# Patient Record
Sex: Male | Born: 1975 | Race: Black or African American | Hispanic: No | Smoking: Current every day smoker
Health system: Southern US, Community
[De-identification: ages and names within clinical notes are randomized; demographics above are authoritative.]

## PROBLEM LIST (undated history)

## (undated) DIAGNOSIS — I10 Essential (primary) hypertension: Secondary | ICD-10-CM

---

## 2008-02-05 ENCOUNTER — Emergency Department (HOSPITAL_COMMUNITY): Admission: EM | Admit: 2008-02-05 | Discharge: 2008-02-05 | Payer: Self-pay | Admitting: Family Medicine

## 2008-06-22 ENCOUNTER — Emergency Department (HOSPITAL_COMMUNITY): Admission: EM | Admit: 2008-06-22 | Discharge: 2008-06-22 | Payer: Self-pay | Admitting: Emergency Medicine

## 2008-11-06 ENCOUNTER — Emergency Department (HOSPITAL_COMMUNITY): Admission: EM | Admit: 2008-11-06 | Discharge: 2008-11-06 | Payer: Self-pay | Admitting: Emergency Medicine

## 2009-03-20 ENCOUNTER — Emergency Department (HOSPITAL_COMMUNITY): Admission: EM | Admit: 2009-03-20 | Discharge: 2009-03-21 | Payer: Self-pay | Admitting: Emergency Medicine

## 2009-03-22 ENCOUNTER — Emergency Department (HOSPITAL_COMMUNITY): Admission: EM | Admit: 2009-03-22 | Discharge: 2009-03-22 | Payer: Self-pay | Admitting: Emergency Medicine

## 2009-04-07 ENCOUNTER — Emergency Department (HOSPITAL_COMMUNITY): Admission: EM | Admit: 2009-04-07 | Discharge: 2009-04-07 | Payer: Self-pay | Admitting: Emergency Medicine

## 2009-06-15 ENCOUNTER — Emergency Department (HOSPITAL_COMMUNITY): Admission: EM | Admit: 2009-06-15 | Discharge: 2009-06-15 | Payer: Self-pay | Admitting: Emergency Medicine

## 2009-07-10 ENCOUNTER — Emergency Department (HOSPITAL_COMMUNITY): Admission: EM | Admit: 2009-07-10 | Discharge: 2009-07-10 | Payer: Self-pay | Admitting: Emergency Medicine

## 2009-07-27 ENCOUNTER — Emergency Department (HOSPITAL_COMMUNITY): Admission: EM | Admit: 2009-07-27 | Discharge: 2009-07-28 | Payer: Self-pay | Admitting: Emergency Medicine

## 2010-03-29 ENCOUNTER — Emergency Department (HOSPITAL_COMMUNITY): Admission: EM | Admit: 2010-03-29 | Discharge: 2009-07-26 | Payer: Self-pay | Admitting: Emergency Medicine

## 2010-07-24 LAB — POCT CARDIAC MARKERS
Myoglobin, poc: 111 ng/mL (ref 12–200)
Troponin i, poc: <0.05 ng/mL (ref 0.00–0.09)

## 2010-07-24 LAB — BASIC METABOLIC PANEL
Calcium: 9.1 mg/dL (ref 8.4–10.5)
Chloride: 106 mEq/L (ref 96–112)
Creatinine, Ser: 1.16 mg/dL (ref 0.4–1.5)
GFR calc non Af Amer: >60 mL/min (ref >60)
Potassium: 3.6 mEq/L (ref 3.5–5.1)

## 2010-07-24 LAB — DIFFERENTIAL
Basophils Absolute: 0.1 10*3/uL (ref 0.0–0.1)
Basophils Relative: 1 % (ref 0–1)
Eosinophils Absolute: 0.2 10*3/uL (ref 0.0–0.7)
Eosinophils Relative: 3 % (ref 0–5)
Lymphs Abs: 2.8 10*3/uL (ref 0.7–4.0)
Monocytes Absolute: 0.7 10*3/uL (ref 0.1–1.0)
Neutrophils Relative %: 54 % (ref 43–77)

## 2010-07-24 LAB — CBC
HCT: 39.5 % (ref 39.0–52.0)
MCHC: 33.7 g/dL (ref 30.0–36.0)
RDW: 12.9 % (ref 11.5–15.5)

## 2010-08-02 LAB — COMPREHENSIVE METABOLIC PANEL
ALT: 18 U/L (ref 0–53)
Albumin: 4 g/dL (ref 3.5–5.2)
Alkaline Phosphatase: 46 U/L (ref 39–117)
BUN: 11 mg/dL (ref 6–23)
Calcium: 9.3 mg/dL (ref 8.4–10.5)
Creatinine, Ser: 1.19 mg/dL (ref 0.4–1.5)
Glucose, Bld: 108 mg/dL — ABNORMAL HIGH (ref 70–99)
Total Bilirubin: 0.6 mg/dL (ref 0.3–1.2)
Total Protein: 6.5 g/dL (ref 6.0–8.3)

## 2010-08-02 LAB — DIFFERENTIAL
Eosinophils Relative: 3 % (ref 0–5)
Monocytes Absolute: 0.4 10*3/uL (ref 0.1–1.0)
Neutrophils Relative %: 48 % (ref 43–77)

## 2010-08-02 LAB — POCT CARDIAC MARKERS
CKMB, poc: <1 ng/mL — ABNORMAL LOW (ref 1.0–8.0)
CKMB, poc: <1 ng/mL — ABNORMAL LOW (ref 1.0–8.0)
Myoglobin, poc: 64.8 ng/mL (ref 12–200)

## 2010-08-02 LAB — CBC
Hemoglobin: 14.3 g/dL (ref 13.0–17.0)
MCHC: 33.5 g/dL (ref 30.0–36.0)
RDW: 13.1 % (ref 11.5–15.5)

## 2011-01-22 LAB — POCT RAPID STREP A: Streptococcus, Group A Screen (Direct): NEGATIVE

## 2011-03-01 ENCOUNTER — Encounter: Payer: Self-pay | Admitting: *Deleted

## 2011-03-01 ENCOUNTER — Emergency Department (HOSPITAL_COMMUNITY): Payer: Self-pay

## 2011-03-01 ENCOUNTER — Emergency Department (HOSPITAL_COMMUNITY)
Admission: EM | Admit: 2011-03-01 | Discharge: 2011-03-01 | Disposition: A | Discharge status: Home / Self Care | Payer: Self-pay | Attending: Emergency Medicine | Admitting: Emergency Medicine

## 2011-03-01 DIAGNOSIS — F419 Anxiety disorder, unspecified: Secondary | ICD-10-CM

## 2011-03-01 DIAGNOSIS — R079 Chest pain, unspecified: Secondary | ICD-10-CM | POA: Insufficient documentation

## 2011-03-01 DIAGNOSIS — R209 Unspecified disturbances of skin sensation: Secondary | ICD-10-CM | POA: Insufficient documentation

## 2011-03-01 DIAGNOSIS — F410 Panic disorder [episodic paroxysmal anxiety] without agoraphobia: Secondary | ICD-10-CM | POA: Insufficient documentation

## 2011-03-01 DIAGNOSIS — F411 Generalized anxiety disorder: Secondary | ICD-10-CM | POA: Insufficient documentation

## 2011-03-01 LAB — BASIC METABOLIC PANEL
BUN: 9 mg/dL (ref 6–23)
Chloride: 105 mEq/L (ref 96–112)
Creatinine, Ser: 1.21 mg/dL (ref 0.50–1.35)
GFR calc non Af Amer: 76 mL/min — ABNORMAL LOW (ref >90)

## 2011-03-01 LAB — CBC
HCT: 40.5 % (ref 39.0–52.0)
Hemoglobin: 13.4 g/dL (ref 13.0–17.0)
MCH: 27.9 pg (ref 26.0–34.0)
WBC: 7.4 10*3/uL (ref 4.0–10.5)

## 2011-03-01 LAB — OCCULT BLOOD, POC DEVICE: Fecal Occult Bld: NEGATIVE

## 2011-03-01 LAB — DIFFERENTIAL
Eosinophils Relative: 2 % (ref 0–5)
Lymphocytes Relative: 41 % (ref 12–46)
Lymphs Abs: 3 10*3/uL (ref 0.7–4.0)
Monocytes Absolute: 0.7 10*3/uL (ref 0.1–1.0)
Neutro Abs: 3.5 10*3/uL (ref 1.7–7.7)

## 2011-03-01 MED ORDER — OXYCODONE-ACETAMINOPHEN 5-325 MG PO TABS
2.0000 | ORAL_TABLET | Freq: Once | ORAL | Status: DC
  Filled 2011-03-01 (×2): qty 2

## 2011-03-01 MED ORDER — LORAZEPAM 1 MG PO TABS: 1.0000 mg | ORAL_TABLET | Freq: Three times a day (TID) | ORAL | Status: AC | PRN

## 2011-03-01 NOTE — ED Notes (Signed)
Pt denies pain at this time.  Pt is afraid he has lung ca b/c his mother died of lung ca d/t smoking.  Lung sounds clear bil.

## 2011-03-01 NOTE — ED Notes (Signed)
Pt awoke at 03:30 with acute onset L chest pain that radiated down his L arm, then up to his head, then over his entire body.  States diaphoresis upon onset of pain, but upon ems arrival, no nv or diaphoresis present.  18G nsl to LFA.  324 aspirin given.

## 2011-03-01 NOTE — ED Provider Notes (Signed)
History     CSN: [id number] Arrival date & time: 03/01/2011  4:39 AM   First MD Initiated Contact with Patient 03/01/11 0449      Chief Complaint  Patient presents with  . Chest Pain    (Consider location/radiation/quality/duration/timing/severity/associated sxs/prior treatment) HPI Comments: Patient states he has this chest pain at night when thinking about all of the stressors. He has no associated shortness of breath, diaphoresis, nausea, vomiting. No prior cardiac history. He has no medical problems. There is no exertional or pleuritic component. In these episodes he also states he has some numbness to his left side of his body. All these symptoms resolve after a few minutes.  Patient is a 35 y.o. male presenting with chest pain. The history is provided by the patient. No language interpreter was used.  Chest Pain The chest pain began 2 days ago. Chest pain occurs intermittently. The chest pain is resolved. The pain is associated with stress. The severity of the pain is mild. The quality of the pain is described as pressure-like. The pain does not radiate. Chest pain is worsened by stress. Pertinent negatives for primary symptoms include no fever, no fatigue, no syncope, no shortness of breath, no cough, no wheezing, no palpitations, no abdominal pain, no nausea, no vomiting and no dizziness.  Associated symptoms include numbness.  Pertinent negatives for associated symptoms include no weakness. He tried nothing for the symptoms. Risk factors include no known risk factors.     No past medical history on file.  No past surgical history on file.  Family History  Problem Relation Age of Onset  . Cancer Mother     History  Substance Use Topics  . Smoking status: Not on file  . Smokeless tobacco: Not on file  . Alcohol Use: No      Review of Systems  Constitutional: Negative for fever, activity change, appetite change and fatigue.  HENT: Negative for congestion, sore  throat, rhinorrhea, neck pain and neck stiffness.   Respiratory: Positive for chest tightness. Negative for cough, shortness of breath and wheezing.   Cardiovascular: Positive for chest pain. Negative for palpitations and syncope.  Gastrointestinal: Negative for nausea, vomiting and abdominal pain.  Genitourinary: Negative for dysuria, urgency, frequency and flank pain.  Musculoskeletal: Negative for myalgias, back pain and arthralgias.  Neurological: Positive for numbness. Negative for dizziness, weakness, light-headedness and headaches.  All other systems reviewed and are negative.    Allergies  Review of patient's allergies indicates no known allergies.  Home Medications   Current Outpatient Rx  Name Route Sig Dispense Refill  . LORAZEPAM 1 MG PO TABS Oral Take 1 tablet (1 mg total) by mouth 3 (three) times daily as needed for anxiety. 6 tablet 0    BP 109/61  Pulse 67  Temp(Src) 98.1 F (36.7 C) (Oral)  Resp 16  SpO2 96%  Physical Exam  Nursing note and vitals reviewed. Constitutional: He is oriented to person, place, and time. He appears well-developed and well-nourished. No distress.  HENT:  Head: Normocephalic and atraumatic.  Mouth/Throat: Oropharynx is clear and moist. No oropharyngeal exudate.  Eyes: Conjunctivae and EOM are normal. Pupils are equal, round, and reactive to light.  Neck: Normal range of motion. Neck supple.  Cardiovascular: Normal rate, regular rhythm, normal heart sounds and intact distal pulses.  Exam reveals no gallop and no friction rub.   No murmur heard. Pulmonary/Chest: Effort normal and breath sounds normal. No respiratory distress.  Abdominal: Soft. Bowel sounds are normal.  There is no tenderness. There is no rebound and no guarding.  Musculoskeletal: Normal range of motion. He exhibits no tenderness.  Neurological: He is alert and oriented to person, place, and time.  Skin: Skin is warm and dry. No rash noted.    ED Course  Procedures  (including critical care time)   Date: 03/01/2011  Rate: 61  Rhythm: normal sinus rhythm  QRS Axis: normal  Intervals: normal  ST/T Wave abnormalities: normal  Conduction Disutrbances:none  Narrative Interpretation:   Old EKG Reviewed: none available  Labs Reviewed  BASIC METABOLIC PANEL - Abnormal; Notable for the following:    GFR calc non Af Amer 76 (*)    GFR calc Af Amer 88 (*)    All other components within normal limits  CBC  DIFFERENTIAL  OCCULT BLOOD, POC DEVICE  POCT I-STAT TROPONIN I  I-STAT TROPONIN I  OCCULT BLOOD X 1 CARD TO LAB, STOOL   Dg Chest 2 View  03/01/2011  *RADIOLOGY REPORT*  Clinical Data: Mid chest pain.  CHEST - 2 VIEW  Comparison: 07/10/2009  Findings: Lungs are clear. No pleural effusion or pneumothorax. The cardiomediastinal contours are within normal limits. The visualized bones and soft tissues are without significant appreciable abnormality.  IMPRESSION: No acute cardiopulmonary process.  Original Report Authenticated By: Waneta Martins, M.D.     1. Panic attack   2. Anxiety       MDM  Patient symptoms are consistent with a panic disorder. This chest pain is present only with stress. During these times he has associated numbness. This is not consistent with a cardiac disease. I did perform screening labs as well as a troponin which was negative. Chest x-ray was negative. EKG is unremarkable. I'm not concerned about ACS as a cause the symptoms nor my concern about a PE. He betrayed with a small prescription of Ativan and [facility].        Dayton Bailiff, MD 03/01/11 [phone]

## 2011-12-11 ENCOUNTER — Encounter (HOSPITAL_COMMUNITY): Payer: Self-pay | Admitting: *Deleted

## 2011-12-11 ENCOUNTER — Observation Stay (HOSPITAL_COMMUNITY)
Admission: EM | Admit: 2011-12-11 | Discharge: 2011-12-12 | Disposition: A | Discharge status: Home / Self Care | Payer: Medicaid Other | Attending: Cardiology | Admitting: Cardiology

## 2011-12-11 DIAGNOSIS — R079 Chest pain, unspecified: Principal | ICD-10-CM | POA: Insufficient documentation

## 2011-12-11 DIAGNOSIS — I5181 Takotsubo syndrome: Secondary | ICD-10-CM | POA: Insufficient documentation

## 2011-12-11 DIAGNOSIS — R0789 Other chest pain: Secondary | ICD-10-CM | POA: Insufficient documentation

## 2011-12-11 DIAGNOSIS — F17200 Nicotine dependence, unspecified, uncomplicated: Secondary | ICD-10-CM | POA: Insufficient documentation

## 2011-12-11 NOTE — ED Notes (Signed)
Pt in via EMS- per EMS- pt in c/o chest pain since 6pm tonight, describes symptoms as tightness that radiated down left arm, shortness of breath, denies other symptoms. Pt currently rating pain 6/10, pt given nitro SL x1, no ASA due to patient taking goody powder recently. IV initiated to left forearm, 20g.

## 2011-12-12 ENCOUNTER — Observation Stay (HOSPITAL_COMMUNITY): Payer: Medicaid Other

## 2011-12-12 ENCOUNTER — Emergency Department (HOSPITAL_COMMUNITY): Payer: Medicaid Other

## 2011-12-12 LAB — CBC WITH DIFFERENTIAL/PLATELET
Basophils Absolute: 0 10*3/uL (ref 0.0–0.1)
Basophils Absolute: 0.1 10*3/uL (ref 0.0–0.1)
Basophils Relative: 1 % (ref 0–1)
Basophils Relative: 1 % (ref 0–1)
Eosinophils Absolute: 0.2 10*3/uL (ref 0.0–0.7)
Eosinophils Relative: 2 % (ref 0–5)
Hemoglobin: 13 g/dL (ref 13.0–17.0)
Lymphocytes Relative: 36 % (ref 12–46)
Lymphocytes Relative: 46 % (ref 12–46)
MCHC: 32.6 g/dL (ref 30.0–36.0)
MCV: 83.4 fL (ref 78.0–100.0)
MCV: 83.6 fL (ref 78.0–100.0)
Neutro Abs: 2.7 10*3/uL (ref 1.7–7.7)
Neutrophils Relative %: 42 % — ABNORMAL LOW (ref 43–77)
Platelets: 237 10*3/uL (ref 150–400)
Platelets: 242 10*3/uL (ref 150–400)
RBC: 4.77 MIL/uL (ref 4.22–5.81)
RDW: 13.6 % (ref 11.5–15.5)
RDW: 13.6 % (ref 11.5–15.5)
WBC: 8 10*3/uL (ref 4.0–10.5)

## 2011-12-12 LAB — COMPREHENSIVE METABOLIC PANEL
ALT: 15 U/L (ref 0–53)
AST: 23 U/L (ref 0–37)
Albumin: 4 g/dL (ref 3.5–5.2)
CO2: 25 mEq/L (ref 19–32)
Calcium: 9.9 mg/dL (ref 8.4–10.5)
Sodium: 138 mEq/L (ref 135–145)
Total Protein: 7 g/dL (ref 6.0–8.3)

## 2011-12-12 LAB — MAGNESIUM: Magnesium: 1.9 mg/dL (ref 1.5–2.5)

## 2011-12-12 LAB — CARDIAC PANEL(CRET KIN+CKTOT+MB+TROPI)
CK, MB: 2.8 ng/mL (ref 0.3–4.0)
Total CK: 374 U/L — ABNORMAL HIGH (ref 7–232)

## 2011-12-12 LAB — TSH: TSH: 2.188 u[IU]/mL (ref 0.350–4.500)

## 2011-12-12 LAB — HEPARIN LEVEL (UNFRACTIONATED): Heparin Unfractionated: 0.17 IU/mL — ABNORMAL LOW (ref 0.30–0.70)

## 2011-12-12 LAB — POCT I-STAT TROPONIN I: Troponin i, poc: 0.01 ng/mL (ref 0.00–0.08)

## 2011-12-12 MED ORDER — ACETAMINOPHEN 325 MG PO TABS: 650.0000 mg | ORAL_TABLET | ORAL | Status: DC | PRN

## 2011-12-12 MED ORDER — HEPARIN (PORCINE) IN NACL 100-0.45 UNIT/ML-% IJ SOLN
12.0000 [IU]/kg/h | INTRAMUSCULAR | Status: DC
  Administered 2011-12-12: 12 [IU]/kg/h via INTRAVENOUS
  Filled 2011-12-12: qty 250

## 2011-12-12 MED ORDER — TECHNETIUM TC 99M TETROFOSMIN IV KIT
10.0000 | PACK | Freq: Once | INTRAVENOUS | Status: AC | PRN
  Administered 2011-12-12: 10 via INTRAVENOUS

## 2011-12-12 MED ORDER — ASPIRIN 81 MG PO CHEW: 324.0000 mg | CHEWABLE_TABLET | ORAL | Status: DC

## 2011-12-12 MED ORDER — NITROGLYCERIN 0.4 MG SL SUBL: 0.4000 mg | SUBLINGUAL_TABLET | SUBLINGUAL | Status: DC | PRN

## 2011-12-12 MED ORDER — SODIUM CHLORIDE 0.9 % IV SOLN
INTRAVENOUS | Status: DC
  Administered 2011-12-12: 10:00:00 via INTRAVENOUS

## 2011-12-12 MED ORDER — PANTOPRAZOLE SODIUM 40 MG PO TBEC: 40.0000 mg | DELAYED_RELEASE_TABLET | Freq: Every day | ORAL | Status: DC

## 2011-12-12 MED ORDER — HEPARIN BOLUS VIA INFUSION
2000.0000 [IU] | Freq: Once | INTRAVENOUS | Status: AC
  Administered 2011-12-12: 2000 [IU] via INTRAVENOUS
  Filled 2011-12-12: qty 2000

## 2011-12-12 MED ORDER — PANTOPRAZOLE SODIUM 40 MG PO TBEC
40.0000 mg | DELAYED_RELEASE_TABLET | Freq: Every day | ORAL | Status: DC
  Administered 2011-12-12: 40 mg via ORAL
  Filled 2011-12-12: qty 1

## 2011-12-12 MED ORDER — LISINOPRIL 5 MG PO TABS: 10.0000 mg | ORAL_TABLET | Freq: Every day | ORAL | Status: DC

## 2011-12-12 MED ORDER — ASPIRIN EC 81 MG PO TBEC: 81.0000 mg | DELAYED_RELEASE_TABLET | Freq: Every day | ORAL | Status: DC

## 2011-12-12 MED ORDER — ONDANSETRON HCL 4 MG/2ML IJ SOLN: 4.0000 mg | Freq: Four times a day (QID) | INTRAMUSCULAR | Status: DC | PRN

## 2011-12-12 MED ORDER — TECHNETIUM TC 99M TETROFOSMIN IV KIT
30.0000 | PACK | Freq: Once | INTRAVENOUS | Status: AC | PRN
  Administered 2011-12-12: 30 via INTRAVENOUS

## 2011-12-12 MED ORDER — ASPIRIN 81 MG PO CHEW: 324.0000 mg | CHEWABLE_TABLET | Freq: Once | ORAL | Status: DC

## 2011-12-12 MED ORDER — ASPIRIN 81 MG PO TBEC: 81.0000 mg | DELAYED_RELEASE_TABLET | Freq: Every day | ORAL | Status: DC

## 2011-12-12 MED ORDER — HEPARIN BOLUS VIA INFUSION
4000.0000 [IU] | Freq: Once | INTRAVENOUS | Status: AC
  Administered 2011-12-12: 4000 [IU] via INTRAVENOUS

## 2011-12-12 MED ORDER — HEPARIN (PORCINE) IN NACL 100-0.45 UNIT/ML-% IJ SOLN
1200.0000 [IU]/h | INTRAMUSCULAR | Status: DC
  Administered 2011-12-12: 1200 [IU]/h via INTRAVENOUS
  Filled 2011-12-12: qty 250

## 2011-12-12 MED ORDER — ASPIRIN 300 MG RE SUPP
300.0000 mg | RECTAL | Status: DC
  Filled 2011-12-12: qty 1

## 2011-12-12 NOTE — Progress Notes (Signed)
ANTICOAGULATION CONSULT NOTE - Follow Up Consult  Pharmacy Consult for Heparin Indication: chest pain/ACS  No Known Allergies  Patient Measurements: Height: 6\' 1"  (185.4 cm) Weight: 175 lb (79.379 kg) IBW/kg (Calculated) : 79.9  Heparin Dosing Weight: 79.4kg  Vital Signs: Temp: 98.1 F (36.7 C) (08/22 0633) Temp src: Oral (08/22 0633) BP: 122/74 mmHg (08/22 0633) Pulse Rate: 58  (08/22 0633)  Labs:  Basename 12/12/11 1030 12/11/11 2359  HGB 13.0 13.9  HCT 39.9 42.8  PLT 237 242  APTT 80* --  LABPROT 14.9 --  INR 1.15 --  HEPARINUNFRC 0.17* --  CREATININE -- 1.14  CKTOTAL -- --  CKMB -- --  TROPONINI -- --    Estimated Creatinine Clearance: 99.6 ml/min (by C-G formula based on Cr of 1.14).   Medications:  Heparin 950 units/hr   Assessment: 36yof on heparin for recurrent CP. Heparin level (0.17) is subtherapeutic. RN reports no problems with line or IV infusion. Noted plans for stress test tomorrow.  - H/H and Plts wnl - No significant bleeding reported  Goal of Therapy:  Heparin level 0.3-0.7 units/ml Monitor platelets by anticoagulation protocol: Yes   Plan:  1. Heparin IV bolus 2000 units x 1 2. Increase heparin drip to 1200 units/hr (12 ml/hr) 3. Check heparin level 6 hours after rate increase  Cleon Dew [phone] 12/12/2011,11:48 AM

## 2011-12-12 NOTE — ED Notes (Signed)
Pt wanted to have something to eat/ Notified EDP

## 2011-12-12 NOTE — H&P (Signed)
Fred Lara is an 36 y.o. male.   Chief Complaint: Recurrent chest pain HPI: Patient is 36 year old male with no significant past medical history except for tobacco abuse came to the ER by EMS complaining of recurrent left-sided and retrosternal chest pain described as sharp and pressure without any associated symptoms states chest pain lasted a few minutes her received one sublingual nitroglycerin by EMS with relief of chest pain. Patient denies any nausea vomiting diaphoresis denies palpitation lightheadedness or syncope denies any history of PND orthopnea leg swelling. Patient denies any relation of chest pain to food breathing or movement. Patient denies any cardiac workup EKG done in the ER showed no acute ischemic changes patient was admitted to the ER for further evaluation of chest pain. Patient denies any cocaine or drug abuse.  History reviewed. No pertinent past medical history.  History reviewed. No pertinent past surgical history.  Family History  Problem Relation Age of Onset  . Cancer Mother    Social History:  does not have a smoking history on file. He does not have any smokeless tobacco history on file. He reports that he does not drink alcohol. His drug history not on file.  Allergies: No Known Allergies  No prescriptions prior to admission    Results for orders placed during the hospital encounter of 12/11/11 (from the past 48 hour(s))  CBC WITH DIFFERENTIAL     Status: Normal   Collection Time   12/11/11 11:59 PM      Component Value Range Comment   WBC 8.0  4.0 - 10.5 K/uL    RBC 5.13  4.22 - 5.81 MIL/uL    Hemoglobin 13.9  13.0 - 17.0 g/dL    HCT 16.1  09.6 - 04.5 %    MCV 83.4  78.0 - 100.0 fL    MCH 27.1  26.0 - 34.0 pg    MCHC 32.5  30.0 - 36.0 g/dL    RDW 40.9  81.1 - 91.4 %    Platelets 242  150 - 400 K/uL    Neutrophils Relative 55  43 - 77 %    Neutro Abs 4.4  1.7 - 7.7 K/uL    Lymphocytes Relative 36  12 - 46 %    Lymphs Abs 2.9  0.7 - 4.0 K/uL      Monocytes Relative 7  3 - 12 %    Monocytes Absolute 0.5  0.1 - 1.0 K/uL    Eosinophils Relative 2  0 - 5 %    Eosinophils Absolute 0.2  0.0 - 0.7 K/uL    Basophils Relative 1  0 - 1 %    Basophils Absolute 0.0  0.0 - 0.1 K/uL   COMPREHENSIVE METABOLIC PANEL     Status: Abnormal   Collection Time   12/11/11 11:59 PM      Component Value Range Comment   Sodium 138  135 - 145 mEq/L    Potassium 3.9  3.5 - 5.1 mEq/L    Chloride 102  96 - 112 mEq/L    CO2 25  19 - 32 mEq/L    Glucose, Bld 107 (*) 70 - 99 mg/dL    BUN 14  6 - 23 mg/dL    Creatinine, Ser 7.82  0.50 - 1.35 mg/dL    Calcium 9.9  8.4 - 95.6 mg/dL    Total Protein 7.0  6.0 - 8.3 g/dL    Albumin 4.0  3.5 - 5.2 g/dL    AST 23  0 -  37 U/L    ALT 15  0 - 53 U/L    Alkaline Phosphatase 50  39 - 117 U/L    Total Bilirubin 0.5  0.3 - 1.2 mg/dL    GFR calc non Af Amer 81 (*) >90 mL/min    GFR calc Af Amer >90  >90 mL/min   POCT I-STAT TROPONIN I     Status: Normal   Collection Time   12/12/11 12:33 AM      Component Value Range Comment   Troponin i, poc 0.00  0.00 - 0.08 ng/mL    Comment 3            POCT I-STAT TROPONIN I     Status: Normal   Collection Time   12/12/11  4:23 AM      Component Value Range Comment   Troponin i, poc 0.01  0.00 - 0.08 ng/mL    Comment 3             Dg Chest 2 View  12/12/2011  *RADIOLOGY REPORT*  Clinical Data: Chest pain  CHEST - 2 VIEW  Comparison:  03/01/2011  Findings:  The heart size and mediastinal contours are within normal limits.  Both lungs are clear.  The visualized skeletal structures are unremarkable.  IMPRESSION: No active cardiopulmonary disease.   Original Report Authenticated By: Camelia Phenes, M.D.     Review of Systems  Constitutional: Negative for fever, chills and weight loss.  HENT: Negative for hearing loss.   Eyes: Negative for blurred vision and double vision.  Respiratory: Negative for cough, hemoptysis, sputum production and shortness of breath.    Cardiovascular: Positive for chest pain. Negative for palpitations, orthopnea, claudication and leg swelling.  Gastrointestinal: Negative for heartburn, nausea and vomiting.  Neurological: Negative for dizziness and headaches.    Blood pressure 122/74, pulse 58, temperature 98.1 F (36.7 C), temperature source Oral, resp. rate 16, weight 79.379 kg (175 lb), SpO2 99.00%. Physical Exam  Constitutional: He is oriented to person, place, and time. He appears well-developed and well-nourished.  HENT:  Head: Normocephalic and atraumatic.  Nose: Nose normal.  Mouth/Throat: No oropharyngeal exudate.  Eyes: Conjunctivae are normal. Pupils are equal, round, and reactive to light. Left eye exhibits no discharge. No scleral icterus.  Neck: Neck supple. No JVD present. No tracheal deviation present. No thyromegaly present.  Cardiovascular: Normal rate, regular rhythm and normal heart sounds.  Exam reveals no gallop and no friction rub.   Respiratory: Effort normal and breath sounds normal. No respiratory distress. He has no wheezes. He has no rales.  GI: Soft. Bowel sounds are normal. He exhibits no distension. There is no tenderness. There is no rebound and no guarding.  Musculoskeletal: He exhibits no edema and no tenderness.  Neurological: He is alert and oriented to person, place, and time.     Assessment/Plan Atypical chest pain rule out MI Tobacco abuse Plan As per orders Schedule for a stress Myoview Fred Lara 12/12/2011, 8:43 AM

## 2011-12-12 NOTE — ED Notes (Signed)
Pt wanted to talk to EDP / notified EDP

## 2011-12-12 NOTE — Progress Notes (Signed)
ANTICOAGULATION CONSULT NOTE - Initial Consult  Pharmacy Consult for heparin Indication: chest pain/ACS  No Known Allergies  Patient Measurements: Weight: 175 lb (79.379 kg) Height: 6'1" per patient Heparin Dosing Weight: 79  Vital Signs: Temp: 98.1 F (36.7 C) (08/22 0633) Temp src: Oral (08/22 0633) BP: 122/74 mmHg (08/22 0633) Pulse Rate: 58  (08/22 0633)  Labs:  Basename 12/11/11 2359  HGB 13.9  HCT 42.8  PLT 242  APTT --  LABPROT --  INR --  HEPARINUNFRC --  CREATININE 1.14  CKTOTAL --  CKMB --  TROPONINI --    CrCl is unknown because there is no height on file for the current visit.   Medical History: History reviewed. No pertinent past medical history.  Medications:  Scheduled:    . heparin  4,000 Units Intravenous Once  . DISCONTD: aspirin  324 mg Oral Once    Assessment: 36 yo male admitted with chest pain. Patient started on IV heparin in ED. Heparin is currently infusing at 950 units/hr.   Goal of Therapy:  Heparin level 0.3-0.7 units/ml Monitor platelets by anticoagulation protocol: Yes   Plan:  1. Continue IV heparin at 950 units/hr 2. Heparin level in 6 hours.  3. Daily CBC, heparin level.   Emeline Gins 12/12/2011,7:19 AM

## 2011-12-12 NOTE — ED Provider Notes (Signed)
History     CSN: [id number]  Arrival date & time 12/11/11  2349   First MD Initiated Contact with Patient 12/12/11 0020      Chief Complaint  Patient presents with  . Chest Pain    (Consider location/radiation/quality/duration/timing/severity/associated sxs/prior treatment) HPI Pt with 2 episode of sharp L sided chest pain this evening. The 1st around 1800 and the 2nd 2100. Both occurred during rest. The 1st resolved spontaneously and the 2nd after NTG by EMS. Pt has had on and off CP for several months. He is currently pain free. Pt has L arm numbness associated with pain. No fever, chills, SOB, lower ext swelling or pain. No CAD hx or fam hx. +2 pk/day smoker x 20+years. Never seen cardiologist or had provocative testing.  History reviewed. No pertinent past medical history.  History reviewed. No pertinent past surgical history.  Family History  Problem Relation Age of Onset  . Cancer Mother     History  Substance Use Topics  . Smoking status: Not on file  . Smokeless tobacco: Not on file  . Alcohol Use: No      Review of Systems  Constitutional: Positive for diaphoresis. Negative for fever and chills.  HENT: Negative for neck pain and neck stiffness.   Respiratory: Negative for chest tightness and shortness of breath.   Cardiovascular: Positive for chest pain. Negative for palpitations and leg swelling.  Gastrointestinal: Negative for nausea, vomiting and abdominal pain.  Skin: Positive for rash. Negative for wound.  Neurological: Negative for facial asymmetry, weakness, numbness and headaches.    Allergies  Review of patient's allergies indicates no known allergies.  Home Medications  No current outpatient prescriptions on file.  BP 132/86  Pulse 56  Temp 98.2 F (36.8 C) (Oral)  Resp 17  SpO2 99%  Physical Exam  Nursing note and vitals reviewed. Constitutional: He is oriented to person, place, and time. He appears well-developed and well-nourished. No  distress.  HENT:  Head: Normocephalic and atraumatic.  Mouth/Throat: Oropharynx is clear and moist.  Eyes: EOM are normal. Pupils are equal, round, and reactive to light.  Neck: Normal range of motion. Neck supple.  Cardiovascular: Normal rate and regular rhythm.  Exam reveals no gallop and no friction rub.   No murmur heard. Pulmonary/Chest: Effort normal and breath sounds normal. No respiratory distress. He has no wheezes. He has no rales. He exhibits no tenderness.  Abdominal: Soft. Bowel sounds are normal. He exhibits no mass. There is no tenderness. There is no rebound.  Musculoskeletal: Normal range of motion. He exhibits no edema and no tenderness.       No calf swelling or tenderness  Neurological: He is alert and oriented to person, place, and time.  Skin: Skin is warm and dry. No rash noted. No erythema.  Psychiatric: He has a normal mood and affect. His behavior is normal.    ED Course  Procedures (including critical care time)  Labs Reviewed  COMPREHENSIVE METABOLIC PANEL - Abnormal; Notable for the following:    Glucose, Bld 107 (*)     GFR calc non Af Amer 81 (*)     All other components within normal limits  CBC WITH DIFFERENTIAL  POCT I-STAT TROPONIN I   Dg Chest 2 View  12/12/2011  *RADIOLOGY REPORT*  Clinical Data: Chest pain  CHEST - 2 VIEW  Comparison:  03/01/2011  Findings:  The heart size and mediastinal contours are within normal limits.  Both lungs are clear.  The  visualized skeletal structures are unremarkable.  IMPRESSION: No active cardiopulmonary disease.   Original Report Authenticated By: Camelia Phenes, M.D.      1. Chest pain       Date: 12/12/2011  Rate: 61  Rhythm: normal sinus rhythm  QRS Axis: normal  Intervals: normal  ST/T Wave abnormalities: normal  Conduction Disutrbances:none  Narrative Interpretation:   Old EKG Reviewed: unchanged   MDM   Based on suspicion for angina and risk factor, will discuss with cardiology  Pt  remains symptom-free. Discussed with Dr Corene Cornea. Asked to have admit orders placed and start pt on heparin.      Loren Racer, MD 12/12/11 [phone]

## 2011-12-12 NOTE — Discharge Summary (Signed)
  Discharge summary dictated on 12/12/2011 dictation [phone]

## 2011-12-12 NOTE — Progress Notes (Signed)
Heparin was disconnected by Joanne Gavel for Stress Test

## 2011-12-12 NOTE — ED Notes (Signed)
Old and new EKG handed to Dr. Ranae Palms.  Extra copies of both placed in pt chart

## 2011-12-12 NOTE — Progress Notes (Signed)
Utilization review completed.  

## 2011-12-12 NOTE — ED Notes (Signed)
Sedation Narrator used for charting purposes. Stress Test by Dr Sharyn Lull.  Walking on treadmill.

## 2011-12-12 NOTE — Progress Notes (Signed)
Paged Dr. Sharyn Lull to notify pt is on floor. Awaiting call back. Pt denies any chest pain at this time. Will continue to monitor.

## 2011-12-12 NOTE — ED Notes (Signed)
Pt is currently in radiology. 

## 2011-12-12 NOTE — ED Notes (Signed)
Pt is back in room from radiology 

## 2011-12-13 NOTE — Discharge Summary (Signed)
Fred Lara, Fred Lara               ACCOUNT NO.:  [id number]  MEDICAL RECORD NO.:  [id number]  LOCATION:  3W01C                        FACILITY:  [facility]  PHYSICIAN:  Eduardo Osier. Sharyn Lull, M.D. DATE OF BIRTH:  [birth year]  DATE OF ADMISSION:  12/11/2011 DATE OF DISCHARGE:  12/12/2011                              DISCHARGE SUMMARY   ADMITTING DIAGNOSES:  Atypical chest pain rule out myocardial infarction and tobacco abuse.  DISCHARGE DIAGNOSES:  Atypical chest pain, myocardial infarction ruled out, rule out gastroesophageal reflux disease, rule out esophageal spasm. Tobacco abuse. Asymptomatic left ventricular dysfunction.  DISCHARGE HOME MEDICATIONS: 1. Enteric-coated aspirin 81 mg 1 tablet daily. 2. Lisinopril 5 mg 1 tablet daily. 3. Nitrostat 0.4 mg sublingual q.5 minutes as directed. 4. Protonix 40 mg 1 tablet daily.  DIET:  Low salt, low cholesterol.  The patient has been advised to stop smoking.  Follow up with me in 1 week.  CONDITION AT DISCHARGE:  Stable.  BRIEF HISTORY AND HOSPITAL COURSE:  Fred Lara is a 36 year old male with no significant past medical history except for tobacco abuse, came to ER by EMS complaining of recurrent left-sided and retrosternal chest pain described as sharp and pressure without any associated symptoms.  States chest pain lasted few minutes.  He received 1 sublingual nitro by EMS with relief of chest pain.  The patient denies any nausea, vomiting, diaphoresis.  Denies palpitation, lightheadedness, or syncope.  Denies history of PND, orthopnea, or leg swelling.  The patient denies any relation of chest pain to food, breathing, or movement.  The patient denies any cardiac workup in the past.  EKG done in the ER showed no acute ischemic changes.  The patient was admitted via ER for further evaluation of chest pain.  The patient denies any cocaine or drug abuse.  PAST MEDICAL HISTORY:  As above.  PAST SURGICAL HISTORY:  None.  FAMILY HISTORY:   Positive for cancer.  SOCIAL HISTORY:  He states he smokes 1-2 packs per day.  No history of alcohol abuse.  No history of cocaine or any drug abuse.  ALLERGIES:  No known drug allergies.  PHYSICAL EXAMINATION:  VITAL SIGNS:  His blood pressure was 122/74, pulse was 58.  He was afebrile.  Conjunctiva was pink. NECK:  Supple.  No JVD.  No bruit. LUNGS:  Clear to auscultation without rhonchi or rales. CARDIOVASCULAR:  S1, S2 was normal.  There was no S3 or S4 gallop. ABDOMEN:  Soft.  Bowel sounds were present.  Nontender. EXTREMITIES:  No clubbing, cyanosis, or edema. NEUROLOGIC:  Grossly intact.  LABORATORY DATA:  His 3 sets of troponin I were negative.  CK was slightly elevated at 374, MB was normal at 2.8.  His sodium was 138, potassium 3.9, BUN 14, creatinine 1.14, glucose was 107, hemoglobin was normal at 13.9, hematocrit 42.8, white count of 8.0.  His lipid panel is still pending or it was not drawn.  His EKG showed normal sinus rhythm with RSR pattern.  No acute ischemic changes were noted.  His stress Myoview showed no evidence of ischemia or infarction.  There was generalized global hypokinesia with EF of 41%.  BRIEF HOSPITAL COURSE:  The patient  was admitted to telemetry unit.  MI was ruled out by serial enzymes and EKG.  The patient did not have any further episodes of chest pain during the hospital stay.  The patient subsequently underwent stress Myoview as per Bruce protocol, which showed no evidence of ischemia, although his EF was only 41%.  The patient denies any history of alcohol abuse in the past.  Denies any cocaine abuse in the past.  Denies history of hypertension.  Denies history of any viral infection in recent past.  The patient denies any history of PND, orthopnea, or leg swelling.  The patient will be started on low-dose ACE inhibitors for asymptomatic LV dysfunction.  We will repeat his 2D echo in few months as an outpatient to evaluate for his  LV systolic dysfunction and maximize his ACE and start him on beta-blockers as tolerated as outpatient.  The patient has been advised to refrain from smoking to which he agrees.  The patient also had some claudication pain during exercise on the treadmill, although his peripheral pulses are intact.  We will start him on low-dose aspirin at this point, and follow him clinically as outpatient.     Eduardo Osier. Sharyn Lull, M.D.     MNH/MEDQ  D:  12/12/2011  T:  12/13/2011  Job:  [id number]

## 2011-12-30 ENCOUNTER — Emergency Department (HOSPITAL_COMMUNITY): Payer: Medicaid Other

## 2011-12-30 ENCOUNTER — Encounter (HOSPITAL_COMMUNITY): Payer: Self-pay | Admitting: Emergency Medicine

## 2011-12-30 ENCOUNTER — Emergency Department (HOSPITAL_COMMUNITY)
Admission: EM | Admit: 2011-12-30 | Discharge: 2011-12-31 | Disposition: A | Discharge status: Home / Self Care | Payer: Medicaid Other | Attending: Emergency Medicine | Admitting: Emergency Medicine

## 2011-12-30 DIAGNOSIS — M79609 Pain in unspecified limb: Secondary | ICD-10-CM | POA: Insufficient documentation

## 2011-12-30 DIAGNOSIS — I10 Essential (primary) hypertension: Secondary | ICD-10-CM | POA: Insufficient documentation

## 2011-12-30 DIAGNOSIS — Z7982 Long term (current) use of aspirin: Secondary | ICD-10-CM | POA: Insufficient documentation

## 2011-12-30 DIAGNOSIS — R079 Chest pain, unspecified: Secondary | ICD-10-CM | POA: Insufficient documentation

## 2011-12-30 HISTORY — DX: Essential (primary) hypertension: I10

## 2011-12-30 LAB — COMPREHENSIVE METABOLIC PANEL
ALT: 18 U/L (ref 0–53)
Alkaline Phosphatase: 46 U/L (ref 39–117)
CO2: 26 mEq/L (ref 19–32)
Calcium: 9.7 mg/dL (ref 8.4–10.5)
Chloride: 102 mEq/L (ref 96–112)
GFR calc Af Amer: 88 mL/min — ABNORMAL LOW (ref >90)
GFR calc non Af Amer: 76 mL/min — ABNORMAL LOW (ref >90)
Glucose, Bld: 115 mg/dL — ABNORMAL HIGH (ref 70–99)
Sodium: 140 mEq/L (ref 135–145)
Total Bilirubin: 0.8 mg/dL (ref 0.3–1.2)

## 2011-12-30 LAB — CBC WITH DIFFERENTIAL/PLATELET
Eosinophils Relative: 2 % (ref 0–5)
HCT: 40.1 % (ref 39.0–52.0)
Lymphocytes Relative: 48 % — ABNORMAL HIGH (ref 12–46)
Lymphs Abs: 3.4 10*3/uL (ref 0.7–4.0)
MCV: 81.7 fL (ref 78.0–100.0)
Neutro Abs: 3 10*3/uL (ref 1.7–7.7)
Platelets: 223 10*3/uL (ref 150–400)
RBC: 4.91 MIL/uL (ref 4.22–5.81)
WBC: 7 10*3/uL (ref 4.0–10.5)

## 2011-12-30 LAB — RAPID URINE DRUG SCREEN, HOSP PERFORMED
Barbiturates: NOT DETECTED
Cocaine: NOT DETECTED
Tetrahydrocannabinol: NOT DETECTED

## 2011-12-30 NOTE — ED Notes (Signed)
Pt reports mid chest radiating to Left arm sharp pain with shortness of breath that resolved after pain resolved with one NTG SL. Pt reports hx of previous cardiac issues and HTN seen hear at [facility] for same 3 weeks ago

## 2011-12-30 NOTE — ED Notes (Signed)
Patient uncooperative at this time.  Would not answer questions and told me there were 5 other nurses asking the same questions and he was not going to tell me anything.  Explained that I do my own assessments to assure that I am seeing the same as the other nurses.  Patient stated he could rip off all this equipment and just leave.  Encouraged him not to leave but to wait to be seen.  Stated he wanted to see a MD

## 2011-12-30 NOTE — ED Notes (Signed)
Pt knows we need a urine sample 

## 2011-12-30 NOTE — ED Provider Notes (Signed)
History     CSN: [id number]  Arrival date & time 12/30/11  2228   First MD Initiated Contact with Patient 12/30/11 2231      Chief Complaint  Patient presents with  . Chest Pain    (Consider location/radiation/quality/duration/timing/severity/associated sxs/prior treatment) HPI Comments: Patient presents via EMS with substernal left-sided chest pain at rest. it radiated down the left arm. Is associated with shortness of breath, nausea, diaphoresis. It radiated to his left neck left arm. It is similar to pain he experienced a few weeks ago when he was admitted to the hospital. He denies any cardiac history denies any cocaine abuse. Is a history of tobacco abuse and hypertension.  Patient is a 36 y.o. male presenting with chest pain. The history is provided by the patient and the EMS personnel.  Chest Pain Primary symptoms include shortness of breath. Pertinent negatives for primary symptoms include no fever, no cough, no abdominal pain, no nausea, no vomiting and no dizziness.     Past Medical History  Diagnosis Date  . Hypertension     History reviewed. No pertinent past surgical history.  Family History  Problem Relation Age of Onset  . Cancer Mother     History  Substance Use Topics  . Smoking status: Current Everyday Smoker -- 1.5 packs/day for 20 years  . Smokeless tobacco: Not on file  . Alcohol Use: No      Review of Systems  Constitutional: Negative for fever, activity change and appetite change.  HENT: Negative for congestion and rhinorrhea.   Respiratory: Positive for chest tightness and shortness of breath. Negative for cough.   Cardiovascular: Positive for chest pain.  Gastrointestinal: Negative for nausea, vomiting and abdominal pain.  Genitourinary: Negative for dysuria and hematuria.  Musculoskeletal: Negative for back pain.  Skin: Negative for rash.  Neurological: Positive for light-headedness. Negative for dizziness and headaches.    Allergies    Review of patient's allergies indicates no known allergies.  Home Medications   Current Outpatient Rx  Name Route Sig Dispense Refill  . ASPIRIN 81 MG PO TBEC Oral Take 1 tablet (81 mg total) by mouth daily. 30 tablet 3  . LISINOPRIL 5 MG PO TABS Oral Take 2 tablets (10 mg total) by mouth daily. 30 tablet 3  . NITROGLYCERIN 0.4 MG SL SUBL Sublingual Place 1 tablet (0.4 mg total) under the tongue every 5 (five) minutes x 3 doses as needed for chest pain. 25 tablet 1  . PANTOPRAZOLE SODIUM 40 MG PO TBEC Oral Take 1 tablet (40 mg total) by mouth daily at 6 (six) AM. 30 tablet 3    BP 124/83  Pulse 72  Temp 98.8 F (37.1 C) (Oral)  Resp 18  SpO2 100%  Physical Exam  Constitutional: He is oriented to person, place, and time. He appears well-developed and well-nourished. No distress.  HENT:  Head: Normocephalic and atraumatic.  Mouth/Throat: Oropharynx is clear and moist.  Eyes: Conjunctivae are normal. Pupils are equal, round, and reactive to light.  Neck: Normal range of motion. Neck supple.  Cardiovascular: Normal rate, regular rhythm and normal heart sounds.   No murmur heard. Pulmonary/Chest: Effort normal and breath sounds normal. No respiratory distress.  Abdominal: Soft. There is no tenderness. There is no rebound and no guarding.  Musculoskeletal: Normal range of motion. He exhibits no edema and no tenderness.  Neurological: He is alert and oriented to person, place, and time. No cranial nerve deficit.  Skin: Skin is warm.  ED Course  Procedures (including critical care time)  Labs Reviewed  CBC WITH DIFFERENTIAL - Abnormal; Notable for the following:    Neutrophils Relative 42 (*)     Lymphocytes Relative 48 (*)     All other components within normal limits  COMPREHENSIVE METABOLIC PANEL - Abnormal; Notable for the following:    Glucose, Bld 115 (*)     GFR calc non Af Amer 76 (*)     GFR calc Af Amer 88 (*)     All other components within normal limits   TROPONIN I  URINE RAPID DRUG SCREEN (HOSP PERFORMED)  TROPONIN I   Dg Chest 2 View  01-17-12  *RADIOLOGY REPORT*  Clinical Data: Mid chest pain radiating to the left arm.  Shortness of breath.  CHEST - 2 VIEW  Comparison: 12/12/2011  Findings: The heart size and pulmonary vascularity are normal. The lungs appear clear and expanded without focal air space disease or consolidation. No blunting of the costophrenic angles.  No pneumothorax.  Mediastinal contours appear intact.  No significant changes since the previous study.  IMPRESSION: No evidence of active pulmonary disease.   Original Report Authenticated By: Marlon Pel, M.D.      1. Chest pain       MDM  Substernal chest pain radiating to left arm, resolved with nitroglycerin. Chest pain-free now, EKG nonischemic. Patient with recent admission a negative stress test. Did not fill his nitroglycerin.  Troponin negative. EKG nonischemic.  Stress test 12/12/11 reviewed. EF 41%.  D/w Dr. Sharyn Lull.  Patient is chest pain free at this time.  Dr. Sharyn Lull recommends repeat troponin and then outpatient followup. Patient unwilling to wait for second troponin.  He understands the risk of leaving including heart attack and death.   Date: Jan 17, 2012  Rate: 78  Rhythm: sinus arrhythmia  QRS Axis: normal  Intervals: normal  ST/T Wave abnormalities: normal  Conduction Disutrbances:none  Narrative Interpretation:   Old EKG Reviewed: unchanged       Glynn Octave, MD 12/31/11 [phone]

## 2011-12-31 NOTE — ED Notes (Signed)
Patient did not want to wait for the next troponin to be completed.

## 2011-12-31 NOTE — ED Notes (Signed)
Pty verbalized understanding of discharge instructions

## 2012-02-09 ENCOUNTER — Encounter (HOSPITAL_COMMUNITY): Payer: Self-pay | Admitting: Emergency Medicine

## 2012-02-09 ENCOUNTER — Emergency Department (HOSPITAL_COMMUNITY)
Admission: EM | Admit: 2012-02-09 | Discharge: 2012-02-09 | Disposition: A | Discharge status: Home / Self Care | Payer: Medicaid Other | Attending: Emergency Medicine | Admitting: Emergency Medicine

## 2012-02-09 DIAGNOSIS — R079 Chest pain, unspecified: Secondary | ICD-10-CM | POA: Insufficient documentation

## 2012-02-09 DIAGNOSIS — F17200 Nicotine dependence, unspecified, uncomplicated: Secondary | ICD-10-CM | POA: Insufficient documentation

## 2012-02-09 DIAGNOSIS — I10 Essential (primary) hypertension: Secondary | ICD-10-CM | POA: Insufficient documentation

## 2012-02-09 DIAGNOSIS — M62838 Other muscle spasm: Secondary | ICD-10-CM

## 2012-02-09 LAB — COMPREHENSIVE METABOLIC PANEL
ALT: 14 U/L (ref 0–53)
Alkaline Phosphatase: 43 U/L (ref 39–117)
BUN: 14 mg/dL (ref 6–23)
CO2: 29 mEq/L (ref 19–32)
Chloride: 101 mEq/L (ref 96–112)
GFR calc Af Amer: 86 mL/min — ABNORMAL LOW (ref >90)
Glucose, Bld: 98 mg/dL (ref 70–99)
Potassium: 4.1 mEq/L (ref 3.5–5.1)
Sodium: 135 mEq/L (ref 135–145)
Total Bilirubin: 0.9 mg/dL (ref 0.3–1.2)

## 2012-02-09 LAB — CBC
HCT: 41.9 % (ref 39.0–52.0)
Hemoglobin: 14.2 g/dL (ref 13.0–17.0)
RBC: 5.09 MIL/uL (ref 4.22–5.81)
WBC: 6.2 10*3/uL (ref 4.0–10.5)

## 2012-02-09 LAB — POCT I-STAT TROPONIN I: Troponin i, poc: 0 ng/mL (ref 0.00–0.08)

## 2012-02-09 MED ORDER — CYCLOBENZAPRINE HCL 10 MG PO TABS: 10.0000 mg | ORAL_TABLET | Freq: Once | ORAL | Status: DC

## 2012-02-09 NOTE — ED Notes (Signed)
Resident delivered D/C instructions with Rx.

## 2012-02-09 NOTE — ED Notes (Signed)
Pt from home. Complaint of CP. Per EMS pt stated cp is intermittent and he has been checked out multiple times but they can't find anything wrong. EMS gave (4) 81mg  ASA and 1 SL NTG. PIV placed by EMS. Pt had stress test and "they couldn't find anything."

## 2012-02-09 NOTE — ED Provider Notes (Signed)
History     CSN: [id number]  Arrival date & time 02/09/12  1427   First MD Initiated Contact with Patient 02/09/12 1502      Chief Complaint  Patient presents with  . Chest Pain    (Consider location/radiation/quality/duration/timing/severity/associated sxs/prior treatment) HPI CC: Chest muscle twitching  Fred Lara w/ persistent L pectoral muscle twicthing. Pain is sharp and there are no alleviating or aggrevating factors. Occurs only during the daytime. Comes and goes typically in spurts. Does not radiate. Compliant w/ home Lisinopril for HTN. Next cardiology appt w/ Dr. Sharyn Lull in Dec. Denies any trauma to the area, palpitations, syncope, dizziness, SOB. Denies recent fever, n/v/d, decreased po. Denies szr h/x and family h/x significant only for lung and brain cancer in pts mother.   Past Medical History  Diagnosis Date  . Hypertension     History reviewed. No pertinent past surgical history.  Family History  Problem Relation Age of Onset  . Cancer Mother     History  Substance Use Topics  . Smoking status: Current Every Day Smoker -- 1.5 packs/day for 20 years  . Smokeless tobacco: Not on file  . Alcohol Use: No      Review of Systems  All other systems reviewed and are negative.    Allergies  Review of patient's allergies indicates no known allergies.  Home Medications   Current Outpatient Rx  Name Route Sig Dispense Refill  . GOODY HEADACHE PO Oral Take 1 Package by mouth daily as needed. For pain    . LISINOPRIL 10 MG PO TABS Oral Take 10 mg by mouth daily.      BP 120/67  Temp 98.6 F (37 C) (Oral)  Resp 16  SpO2 98%  Physical Exam  Nursing note and vitals reviewed. Constitutional: He is oriented to person, place, and time. He appears well-developed and well-nourished. No distress.  HENT:  Head: Normocephalic and atraumatic.  Eyes: Pupils are equal, round, and reactive to light.  Neck: Normal range of motion.  Cardiovascular:  Normal rate and intact distal pulses.   Pulmonary/Chest: No respiratory distress.  Abdominal: Normal appearance. He exhibits no distension.  Musculoskeletal: Normal range of motion.       L peck twitching and muscle tightness on palpation No Chvostek sign  Neurological: He is alert and oriented to person, place, and time. No cranial nerve deficit.  Skin: Skin is warm and dry. No rash noted.  Psychiatric: He has a normal mood and affect. His behavior is normal.    ED Course  Procedures (including critical care time)   Labs Reviewed  CBC  COMPREHENSIVE METABOLIC PANEL  URINE RAPID DRUG SCREEN (HOSP PERFORMED)   No results found.   No diagnosis found.    MDM  Fred Lara w/ significant recent cardiac workup for CP and EF of 40% w/ 3 day hisotry of episodic muscle twitching/spasm.  - CBC, CMET, POC Troponin, UDS. - EKG w/o evidence of MI  Update: Pt no longer w/ muscle twitch. Desiring to leave. No evidence of cardiac involvement. Likely muscle spasm Labs reviewed and w/o clear cause of symptoms (such as low Ca) -flexeril 10mg  - handout given. Pt to f/u w/ pcp  Shelly Flatten, MD Family Medicine PGY-2 02/09/2012, 4:38 PM         Ozella Rocks, MD 02/09/12 1640  Ozella Rocks, MD 02/09/12 [phone]

## 2012-02-09 NOTE — ED Provider Notes (Signed)
I saw and evaluated the patient, reviewed the resident's note and I agree with the findings and plan.   .Face to face Exam:  General:  Awake HEENT:  Atraumatic Resp:  Normal effort Abd:  Nondistended Neuro:No focal weakness Lymph: No adenopathy   Nelia Shi, MD 02/09/12 [phone]

## 2012-02-09 NOTE — ED Notes (Signed)
Pt would like to leave now.  Resident and MD informed.  Pt does not want to wait for d/c papers. D/c AMA now.

## 2012-11-25 ENCOUNTER — Emergency Department (HOSPITAL_COMMUNITY)
Admission: EM | Admit: 2012-11-25 | Discharge: 2012-11-25 | Disposition: A | Discharge status: Home / Self Care | Payer: Medicaid Other | Attending: Emergency Medicine | Admitting: Emergency Medicine

## 2012-11-25 ENCOUNTER — Encounter (HOSPITAL_COMMUNITY): Payer: Self-pay | Admitting: Emergency Medicine

## 2012-11-25 DIAGNOSIS — Z20828 Contact with and (suspected) exposure to other viral communicable diseases: Secondary | ICD-10-CM | POA: Insufficient documentation

## 2012-11-25 DIAGNOSIS — R369 Urethral discharge, unspecified: Secondary | ICD-10-CM

## 2012-11-25 DIAGNOSIS — I10 Essential (primary) hypertension: Secondary | ICD-10-CM | POA: Insufficient documentation

## 2012-11-25 DIAGNOSIS — Z202 Contact with and (suspected) exposure to infections with a predominantly sexual mode of transmission: Secondary | ICD-10-CM

## 2012-11-25 DIAGNOSIS — R42 Dizziness and giddiness: Secondary | ICD-10-CM | POA: Insufficient documentation

## 2012-11-25 DIAGNOSIS — F17200 Nicotine dependence, unspecified, uncomplicated: Secondary | ICD-10-CM | POA: Insufficient documentation

## 2012-11-25 DIAGNOSIS — R300 Dysuria: Secondary | ICD-10-CM | POA: Insufficient documentation

## 2012-11-25 MED ORDER — AZITHROMYCIN 250 MG PO TABS
1000.0000 mg | ORAL_TABLET | Freq: Once | ORAL | Status: AC
  Administered 2012-11-25: 1000 mg via ORAL
  Filled 2012-11-25: qty 4

## 2012-11-25 MED ORDER — CEFTRIAXONE SODIUM 250 MG IJ SOLR
250.0000 mg | Freq: Once | INTRAMUSCULAR | Status: AC
  Administered 2012-11-25: 250 mg via INTRAMUSCULAR
  Filled 2012-11-25: qty 250

## 2012-11-25 NOTE — ED Notes (Signed)
Pt reports with a request to be tested for STD. Pt states he was exposed to trichomonas. Pt reports having a white discharge from the tip of his penis. Pt is A/Ox4, vital signs are stable, NAD.

## 2012-11-25 NOTE — ED Provider Notes (Signed)
CSN: [id number]     Arrival date & time 11/25/12  1805 History    This chart was scribed for Junius Finner, PA working with Flint Melter, MD by Quintella Reichert, ED Scribe. This patient was seen in room WTR5/WTR5 and the patient's care was started at 9:04 PM.     Chief Complaint  Patient presents with  . Exposure to STD    The history is provided by the patient. No language interpreter was used.    HPI Comments: Fred Lara is a 37 y.o. male who presents to the Emergency Department complaining of exposure to trichomoniasis.  Pt states "I know I have trichomoniasis" and he has had progressively-worsening symptoms for one week including dizziness, lightheadedness, white penile discharge, and dysuria.  There are no aggravating or alleviating factors and he has not taken any medications pta.  Pt notes he has had trichomoniasis before and his present symptoms are the same.  He denies fever.  He denies chronic medical conditions or regular medication usage.  He denies medication allergies.    Past Medical History  Diagnosis Date  . Hypertension     History reviewed. No pertinent past surgical history.   Family History  Problem Relation Age of Onset  . Cancer Mother     History  Substance Use Topics  . Smoking status: Current Every Day Smoker -- 1.50 packs/day for 20 years    Types: Cigarettes  . Smokeless tobacco: Never Used  . Alcohol Use: No     Review of Systems  Constitutional: Negative for fever.  Genitourinary: Positive for dysuria and discharge.  Neurological: Positive for dizziness and light-headedness.  All other systems reviewed and are negative.      Allergies  Review of patient's allergies indicates no known allergies.  Home Medications   Current Outpatient Rx  Name  Route  Sig  Dispense  Refill  . ibuprofen (ADVIL,MOTRIN) 200 MG tablet   Oral   Take 600 mg by mouth every 6 (six) hours as needed for pain.          BP 139/88  Pulse 79   Temp(Src) 99.3 F (37.4 C) (Oral)  Resp 16  SpO2 100%  Physical Exam  Nursing note and vitals reviewed. Constitutional: He is oriented to person, place, and time. He appears well-developed and well-nourished. No distress.  HENT:  Head: Normocephalic and atraumatic.  Eyes: EOM are normal.  Neck: Neck supple. No tracheal deviation present.  Cardiovascular: Normal rate.   Pulmonary/Chest: Effort normal. No respiratory distress.  Genitourinary: Testes normal and penis normal.  Musculoskeletal: Normal range of motion.  Neurological: He is alert and oriented to person, place, and time.  Skin: Skin is warm and dry.  Psychiatric: He has a normal mood and affect. His behavior is normal.    ED Course  Procedures (including critical care time)  DIAGNOSTIC STUDIES: Oxygen Saturation is 100% on room air, normal by my interpretation.    COORDINATION OF CARE: 9:07 PM-Discussed treatment plan which includes STD test with pt at bedside and pt agreed to plan.    Labs Reviewed  GC/CHLAMYDIA PROBE AMP    No results found.  1. Exposure to STD   2. Penile discharge   3. Dysuria     MDM  GC/Chlamydia swab sent to lab.  Pt informed results would be given to him in 2-3 days.  Pt tx empirically with azithromycin and rocephin.  Discussed having further STI testing at health department as needed.  Discussed  using barrier protection for sexual intercourse and refrain from sexual activity until all sexual partners had been tested and treated.    I personally performed the services described in this documentation, which was scribed in my presence. The recorded information has been reviewed and is accurate.      Junius Finner, PA-C 11/27/12 2048

## 2012-11-25 NOTE — ED Notes (Signed)
Pt ambulatory to exam room with steady gait.  

## 2012-11-26 LAB — GC/CHLAMYDIA PROBE AMP
CT Probe RNA: NEGATIVE
GC Probe RNA: NEGATIVE

## 2012-11-28 NOTE — ED Provider Notes (Signed)
Medical screening examination/treatment/procedure(s) were performed by non-physician practitioner and as supervising physician I was immediately available for consultation/collaboration.  Arael Piccione L Laneah Luft, MD 11/28/12 0708 

## 2013-12-30 IMAGING — CR DG CHEST 2V
2 series · 2 of 2 positions shown · non-contrast
Comparison: 03/01/2011

CLINICAL DATA: Chest pain

CHEST - 2 VIEW

[w chest pa]
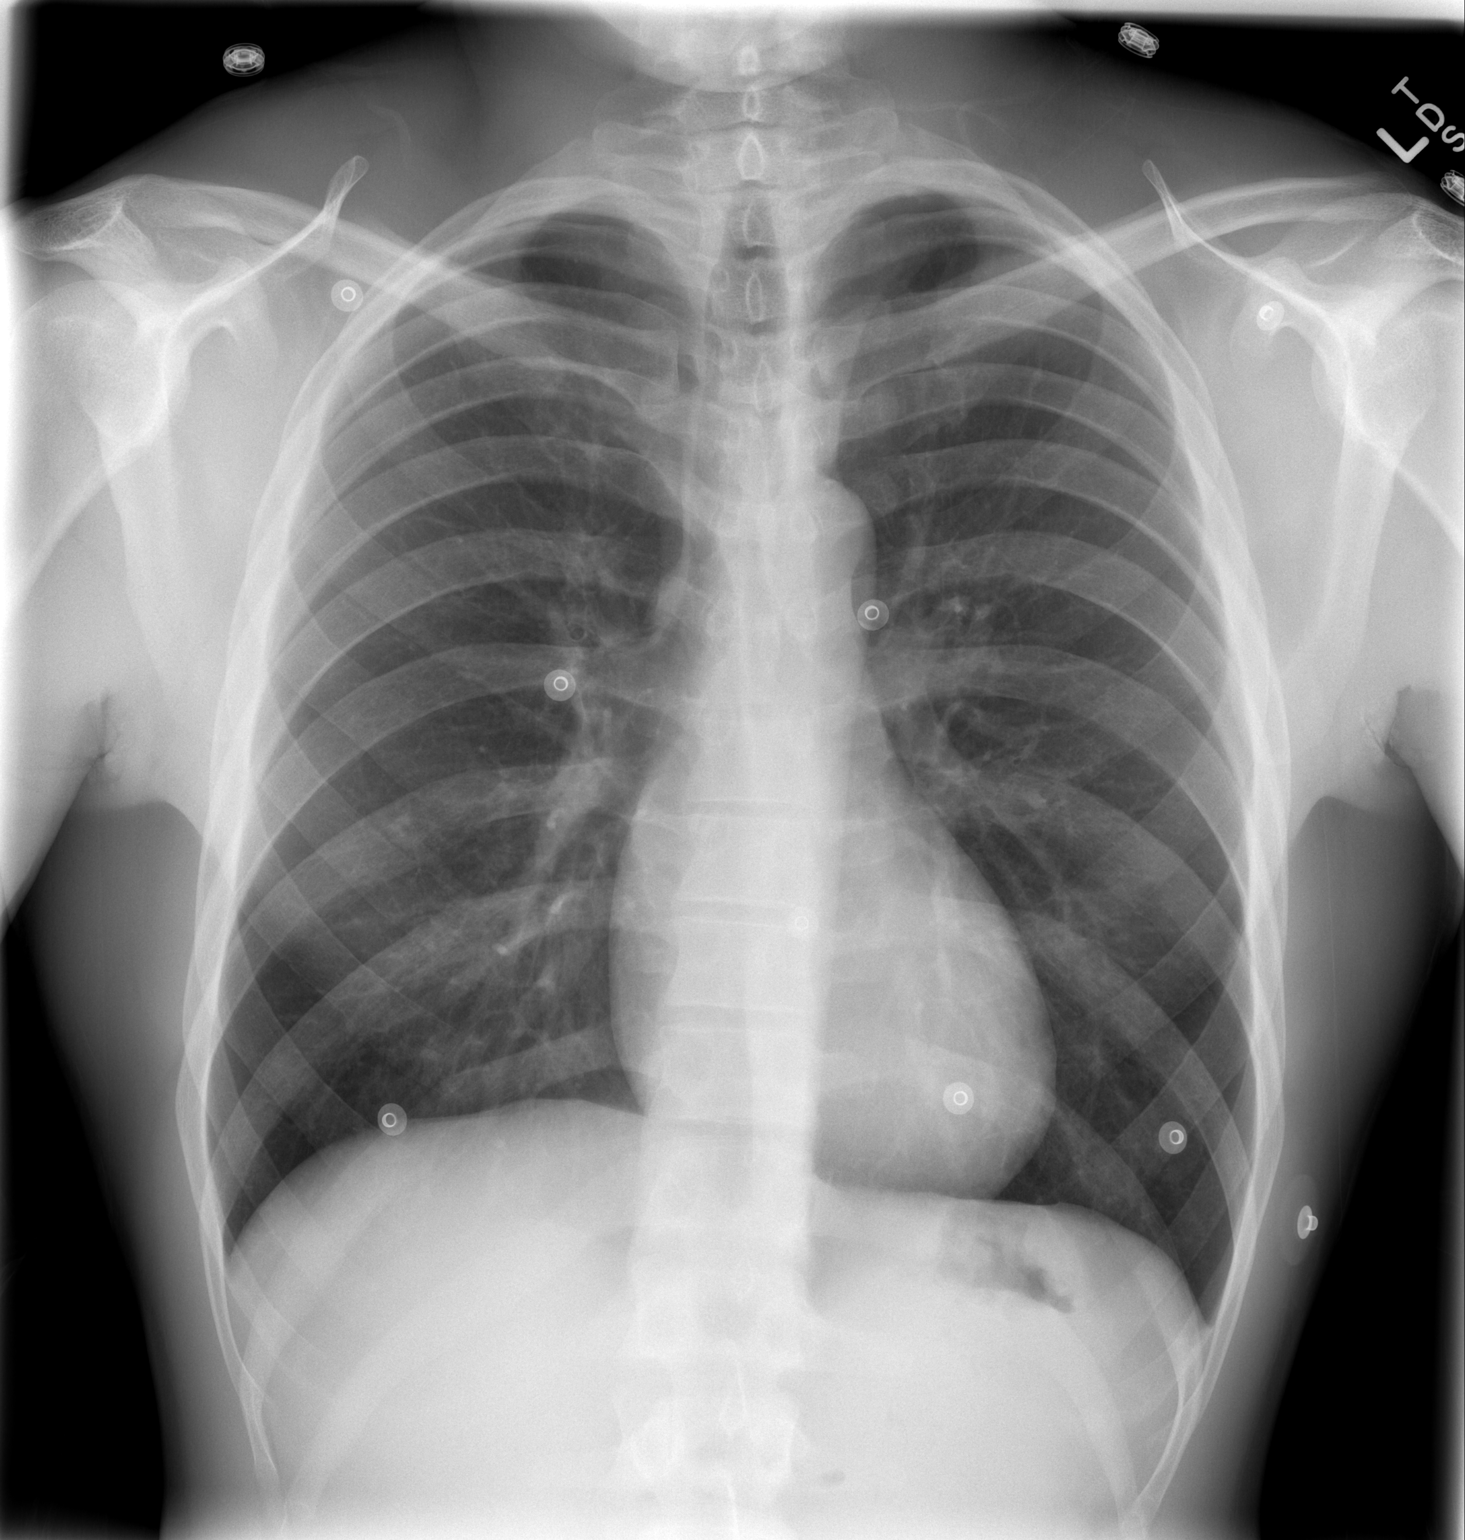

[w chest lat]
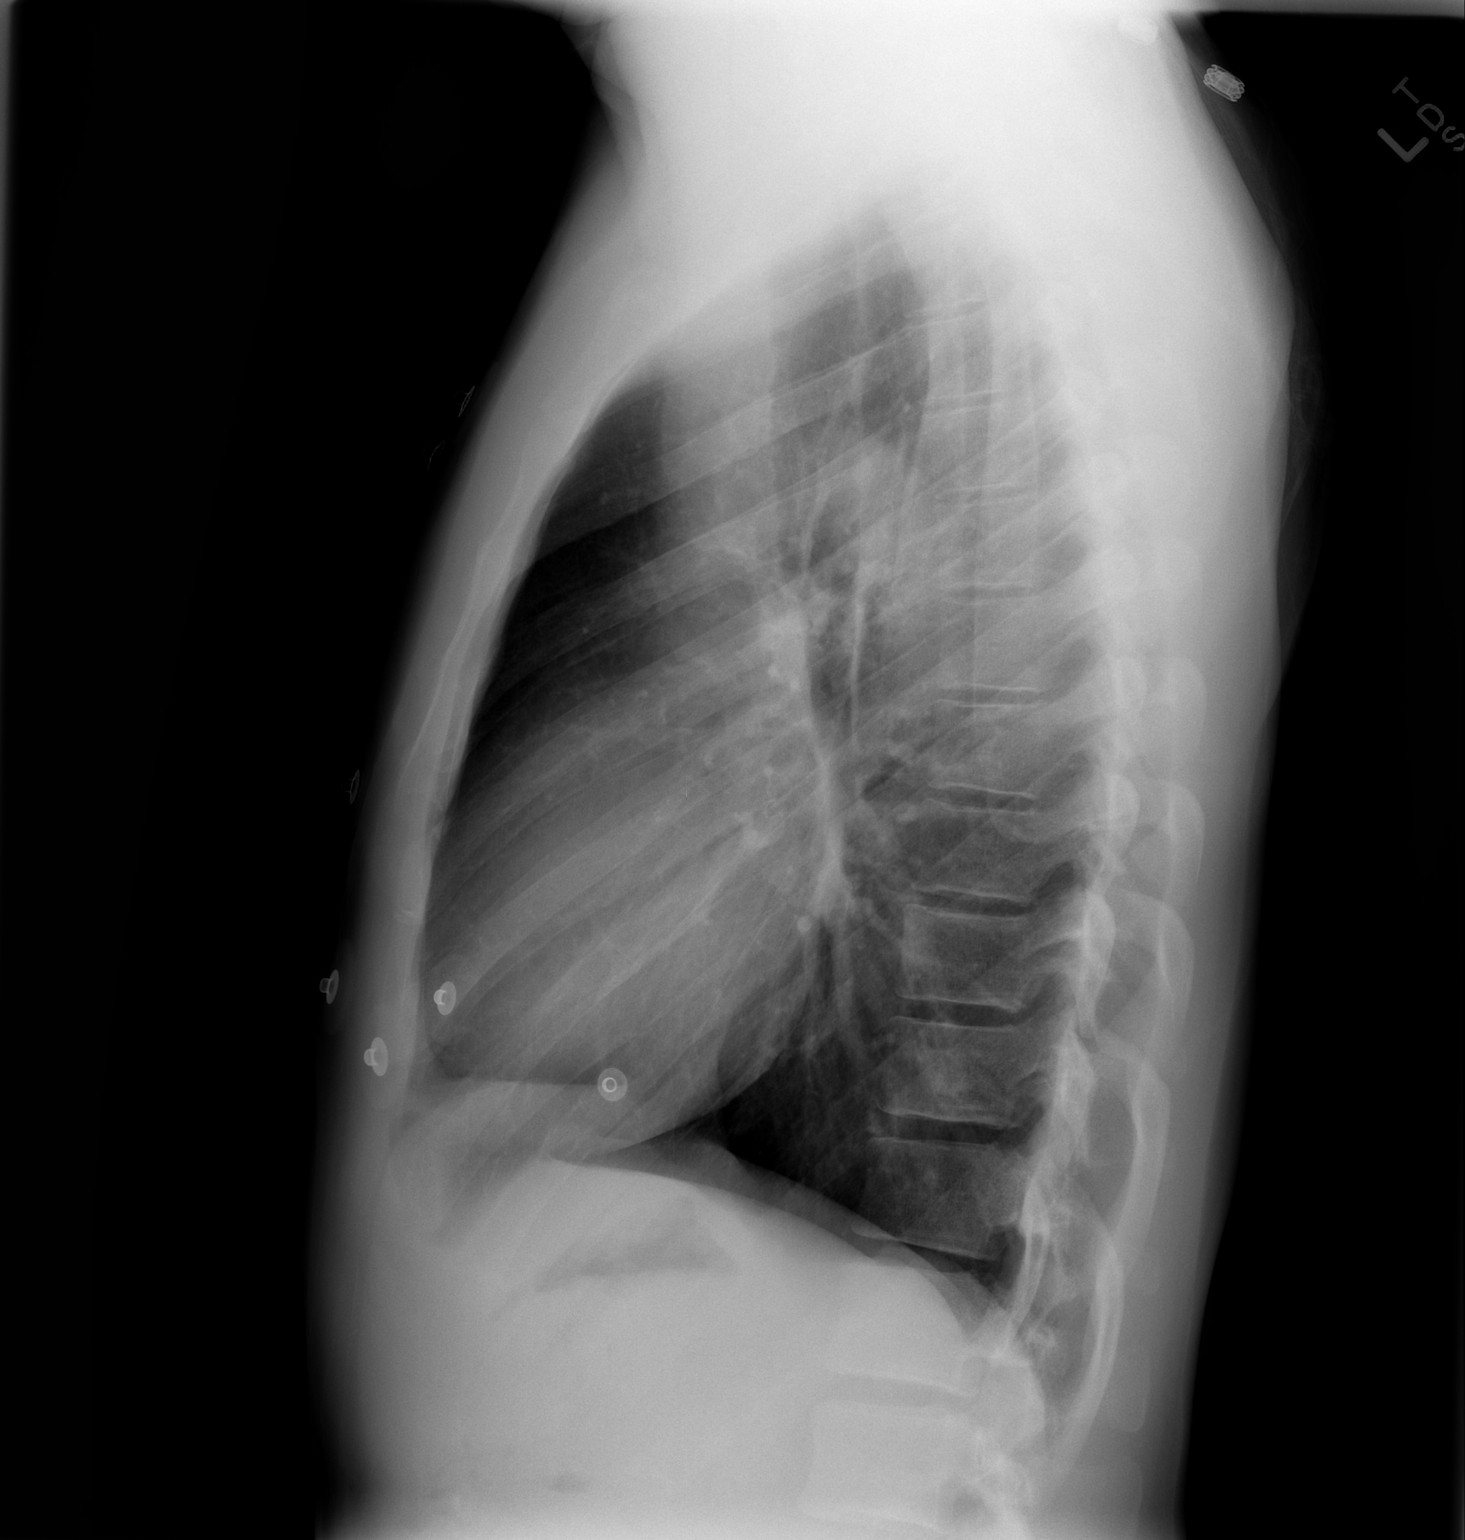

[2 of 2 positions shown; findings below may reference images not displayed]

FINDINGS: The heart size and mediastinal contours are within
normal limits.  Both lungs are clear.  The visualized skeletal
structures are unremarkable.
IMPRESSION: No active cardiopulmonary disease.

## 2017-09-19 ENCOUNTER — Emergency Department (HOSPITAL_COMMUNITY): Payer: Medicaid Other

## 2017-09-19 ENCOUNTER — Other Ambulatory Visit: Payer: Self-pay

## 2017-09-19 ENCOUNTER — Encounter (HOSPITAL_COMMUNITY): Payer: Self-pay | Admitting: Emergency Medicine

## 2017-09-19 ENCOUNTER — Emergency Department (HOSPITAL_COMMUNITY)
Admission: EM | Admit: 2017-09-19 | Discharge: 2017-09-19 | Disposition: A | Discharge status: Home / Self Care | Payer: Medicaid Other | Attending: Emergency Medicine | Admitting: Emergency Medicine

## 2017-09-19 DIAGNOSIS — R072 Precordial pain: Secondary | ICD-10-CM | POA: Diagnosis present

## 2017-09-19 DIAGNOSIS — I10 Essential (primary) hypertension: Secondary | ICD-10-CM | POA: Diagnosis not present

## 2017-09-19 DIAGNOSIS — R0602 Shortness of breath: Secondary | ICD-10-CM | POA: Insufficient documentation

## 2017-09-19 DIAGNOSIS — R0789 Other chest pain: Secondary | ICD-10-CM | POA: Insufficient documentation

## 2017-09-19 DIAGNOSIS — F17210 Nicotine dependence, cigarettes, uncomplicated: Secondary | ICD-10-CM | POA: Insufficient documentation

## 2017-09-19 LAB — CBC
HEMATOCRIT: 41.4 % (ref 39.0–52.0)
Hemoglobin: 13.5 g/dL (ref 13.0–17.0)
MCH: 28.1 pg (ref 26.0–34.0)
MCHC: 32.6 g/dL (ref 30.0–36.0)
MCV: 86.1 fL (ref 78.0–100.0)
Platelets: 302 10*3/uL (ref 150–400)
RBC: 4.81 MIL/uL (ref 4.22–5.81)
RDW: 13.4 % (ref 11.5–15.5)
WBC: 5.4 10*3/uL (ref 4.0–10.5)

## 2017-09-19 LAB — BASIC METABOLIC PANEL
Anion gap: 7 (ref 5–15)
BUN: 9 mg/dL (ref 6–20)
CALCIUM: 8.9 mg/dL (ref 8.9–10.3)
CO2: 23 mmol/L (ref 22–32)
Chloride: 105 mmol/L (ref 101–111)
Creatinine, Ser: 1.19 mg/dL (ref 0.61–1.24)
GFR calc Af Amer: >60 mL/min (ref >60)
GLUCOSE: 101 mg/dL — AB (ref 65–99)
POTASSIUM: 3.8 mmol/L (ref 3.5–5.1)
SODIUM: 135 mmol/L (ref 135–145)

## 2017-09-19 LAB — I-STAT TROPONIN, ED: Troponin i, poc: 0 ng/mL (ref 0.00–0.08)

## 2017-09-19 MED ORDER — SODIUM CHLORIDE 0.9 % IV BOLUS
1000.0000 mL | Freq: Once | INTRAVENOUS | Status: AC
Start: 2017-09-19 — End: 2017-09-19
  Administered 2017-09-19: 1000 mL via INTRAVENOUS

## 2017-09-19 NOTE — Discharge Instructions (Addendum)
Ibuprofen 600 mg every 6 hours for the next 3 days.  All up with your primary doctor if not improving in the next few days, and return to the ER if symptoms significantly worsen or change.

## 2017-09-19 NOTE — ED Triage Notes (Signed)
Pt to ED via EMS c/o "20/10" cp while laying down radiating down L arm and legs. Hx CVA 2014. 96 RA, 110 HR 135/66, 16 RR. 324 ASA 2 SLN PTA. Pain 7/10 on arrival. NAD. Pt also c/o blood in stool and swollen R knee.

## 2017-09-19 NOTE — ED Provider Notes (Signed)
[facility] EMERGENCY DEPARTMENT Provider Note   CSN: [id number] Arrival date & time: 09/19/17  0341     History   Chief Complaint Chief Complaint  Patient presents with  . Chest Pain    HPI Fred Lara is a 42 y.o. male.  Patient is a 42 year old male with past medical history of hypertension.  He presents for evaluation of chest discomfort.  This started just prior to arrival.  He was laying down when the symptoms began.  He stays in a hotel and this is where EMS transported him from.  He denies nausea or diaphoresis.  He states he felt short of breath with the pain.  His pain was described as sharp in nature and he rated it as a "20 out of 10".  The history is provided by the patient.  Chest Pain   This is a new problem. The current episode started 1 to 2 hours ago. The problem occurs constantly. The problem has been resolved. The pain is present in the substernal region. The pain is severe. The quality of the pain is described as sharp. The pain does not radiate.    Past Medical History:  Diagnosis Date  . Hypertension     There are no active problems to display for this patient.   No past surgical history on file.      Home Medications    Prior to Admission medications   Medication Sig Start Date End Date Taking? Authorizing Provider  ibuprofen (ADVIL,MOTRIN) 200 MG tablet Take 600 mg by mouth every 6 (six) hours as needed for pain.    [provider]    Family History Family History  Problem Relation Age of Onset  . Cancer Mother     Social History Social History   Tobacco Use  . Smoking status: Current Every Day Smoker    Packs/day: 1.50    Years: 20.00    Pack years: 30.00    Types: Cigarettes  . Smokeless tobacco: Never Used  Substance Use Topics  . Alcohol use: No  . Drug use: No     Allergies   Patient has no known allergies.   Review of Systems Review of Systems  Cardiovascular: Positive for chest  pain.  All other systems reviewed and are negative.    Physical Exam Updated Vital Signs BP 129/72   Pulse 65   Temp 98.4 F (36.9 C) (Oral)   Resp 19   Ht  (1.854 m)   Wt 85.7 kg (189 lb)   SpO2 100%   BMI 24.94 kg/m   Physical Exam  Constitutional: He is oriented to person, place, and time. He appears well-developed and well-nourished. No distress.  HENT:  Head: Normocephalic and atraumatic.  Mouth/Throat: Oropharynx is clear and moist.  Neck: Normal range of motion. Neck supple.  Cardiovascular: Normal rate and regular rhythm. Exam reveals no friction rub.  No murmur heard. Pulmonary/Chest: Effort normal and breath sounds normal. No respiratory distress. He has no wheezes. He has no rales.  Abdominal: Soft. Bowel sounds are normal. He exhibits no distension. There is no tenderness.  Musculoskeletal: Normal range of motion. He exhibits no edema.       Right lower leg: Normal. He exhibits no tenderness and no edema.       Left lower leg: Normal. He exhibits no tenderness and no edema.  Neurological: He is alert and oriented to person, place, and time. Coordination normal.  Skin: Skin is warm and  dry. He is not diaphoretic.  Nursing note and vitals reviewed.    ED Treatments / Results  Labs (all labs ordered are listed, but only abnormal results are displayed) Labs Reviewed  BASIC METABOLIC PANEL  CBC  I-STAT TROPONIN, ED    ED ECG REPORT   Date: 09/19/2017  Rate: 66  Rhythm: normal sinus rhythm  QRS Axis: right  Intervals: normal  ST/T Wave abnormalities: normal  Conduction Disutrbances:none  Narrative Interpretation:   Old EKG Reviewed: unchanged  I have personally reviewed the EKG tracing and agree with the computerized printout as noted.   Radiology Dg Chest 2 View  Result Date: 09/19/2017 CLINICAL DATA:  Acute onset of severe generalized chest pain, radiating down the left arm and legs. EXAM: CHEST - 2 VIEW COMPARISON:  Chest radiograph  performed 12/30/2011 FINDINGS: The lungs are well-aerated. Mild medial right basilar airspace opacity may reflect atelectasis or possibly pneumonia. There is no evidence of pleural effusion or pneumothorax. The heart is normal in size; the mediastinal contour is within normal limits. No acute osseous abnormalities are seen. IMPRESSION: Mild medial right basilar airspace opacity may reflect atelectasis or possibly pneumonia. Electronically Signed   By: Roanna Raider M.D.   On: 09/19/2017 04:30    Procedures Procedures (including critical care time)  Medications Ordered in ED Medications  sodium chloride 0.9 % bolus 1,000 mL (has no administration in time range)     Initial Impression / Assessment and Plan / ED Course  I have reviewed the triage vital signs and the nursing notes.  Pertinent labs & imaging results that were available during my care of the patient were reviewed by me and considered in my medical decision making (see chart for details).  Patient presents with complaints of chest pain that woke him from sleep.  His EKG is unchanged and troponin is negative.  He is feeling better after IV fluids.  I highly doubt a cardiac etiology and feel as though he is appropriate for discharge.  Final Clinical Impressions(s) / ED Diagnoses   Final diagnoses:  None    ED Discharge Orders    None       Geoffery Lyons, MD 09/19/17 [phone]

## 2017-09-25 ENCOUNTER — Encounter (HOSPITAL_COMMUNITY): Payer: Self-pay | Admitting: Emergency Medicine

## 2017-09-25 ENCOUNTER — Emergency Department (HOSPITAL_COMMUNITY)
Admission: EM | Admit: 2017-09-25 | Discharge: 2017-09-25 | Disposition: A | Discharge status: Home / Self Care | Payer: Medicaid Other | Attending: Physician Assistant | Admitting: Physician Assistant

## 2017-09-25 ENCOUNTER — Emergency Department (HOSPITAL_COMMUNITY): Payer: Medicaid Other

## 2017-09-25 DIAGNOSIS — F17210 Nicotine dependence, cigarettes, uncomplicated: Secondary | ICD-10-CM | POA: Insufficient documentation

## 2017-09-25 DIAGNOSIS — K529 Noninfective gastroenteritis and colitis, unspecified: Secondary | ICD-10-CM | POA: Diagnosis not present

## 2017-09-25 DIAGNOSIS — R112 Nausea with vomiting, unspecified: Secondary | ICD-10-CM

## 2017-09-25 DIAGNOSIS — I10 Essential (primary) hypertension: Secondary | ICD-10-CM | POA: Insufficient documentation

## 2017-09-25 DIAGNOSIS — R109 Unspecified abdominal pain: Secondary | ICD-10-CM | POA: Diagnosis present

## 2017-09-25 DIAGNOSIS — R197 Diarrhea, unspecified: Secondary | ICD-10-CM

## 2017-09-25 LAB — CBC WITH DIFFERENTIAL/PLATELET
BASOS ABS: 0 10*3/uL (ref 0.0–0.1)
BASOS PCT: 0 %
Eosinophils Absolute: 0 10*3/uL (ref 0.0–0.7)
Eosinophils Relative: 1 %
HEMATOCRIT: 47.4 % (ref 39.0–52.0)
Hemoglobin: 15.8 g/dL (ref 13.0–17.0)
Lymphocytes Relative: 2 %
Lymphs Abs: 0.2 10*3/uL — ABNORMAL LOW (ref 0.7–4.0)
MCH: 28.8 pg (ref 26.0–34.0)
MCHC: 33.3 g/dL (ref 30.0–36.0)
MCV: 86.5 fL (ref 78.0–100.0)
MONO ABS: 0.5 10*3/uL (ref 0.1–1.0)
Monocytes Relative: 5 %
NEUTROS ABS: 8 10*3/uL — AB (ref 1.7–7.7)
Neutrophils Relative %: 92 %
Platelets: 274 10*3/uL (ref 150–400)
RBC: 5.48 MIL/uL (ref 4.22–5.81)
RDW: 13.7 % (ref 11.5–15.5)
WBC: 8.7 10*3/uL (ref 4.0–10.5)

## 2017-09-25 LAB — COMPREHENSIVE METABOLIC PANEL
ALT: 27 U/L (ref 17–63)
AST: 40 U/L (ref 15–41)
Albumin: 4.1 g/dL (ref 3.5–5.0)
Alkaline Phosphatase: 39 U/L (ref 38–126)
Anion gap: 8 (ref 5–15)
BILIRUBIN TOTAL: 1.6 mg/dL — AB (ref 0.3–1.2)
BUN: 16 mg/dL (ref 6–20)
CO2: 26 mmol/L (ref 22–32)
Calcium: 8.7 mg/dL — ABNORMAL LOW (ref 8.9–10.3)
Chloride: 105 mmol/L (ref 101–111)
Creatinine, Ser: 1.02 mg/dL (ref 0.61–1.24)
Glucose, Bld: 105 mg/dL — ABNORMAL HIGH (ref 65–99)
POTASSIUM: 5.4 mmol/L — AB (ref 3.5–5.1)
Sodium: 139 mmol/L (ref 135–145)
TOTAL PROTEIN: 7.1 g/dL (ref 6.5–8.1)

## 2017-09-25 LAB — BASIC METABOLIC PANEL
ANION GAP: 8 (ref 5–15)
ANION GAP: 8 (ref 5–15)
BUN: 14 mg/dL (ref 6–20)
BUN: 15 mg/dL (ref 6–20)
CHLORIDE: 109 mmol/L (ref 101–111)
CHLORIDE: 104 mmol/L (ref 101–111)
CO2: 21 mmol/L — ABNORMAL LOW (ref 22–32)
CO2: 25 mmol/L (ref 22–32)
Calcium: 7.3 mg/dL — ABNORMAL LOW (ref 8.9–10.3)
Calcium: 8.5 mg/dL — ABNORMAL LOW (ref 8.9–10.3)
Creatinine, Ser: 0.94 mg/dL (ref 0.61–1.24)
Creatinine, Ser: 1.12 mg/dL (ref 0.61–1.24)
GFR calc Af Amer: >60 mL/min (ref >60)
GFR calc Af Amer: >60 mL/min (ref >60)
Glucose, Bld: 89 mg/dL (ref 65–99)
Glucose, Bld: 130 mg/dL — ABNORMAL HIGH (ref 65–99)
Potassium: 5.7 mmol/L — ABNORMAL HIGH (ref 3.5–5.1)
Potassium: 3.5 mmol/L (ref 3.5–5.1)
SODIUM: 138 mmol/L (ref 135–145)
SODIUM: 137 mmol/L (ref 135–145)

## 2017-09-25 LAB — LIPASE, BLOOD: LIPASE: 32 U/L (ref 11–51)

## 2017-09-25 LAB — CK: Total CK: 380 U/L (ref 49–397)

## 2017-09-25 MED ORDER — ACETAMINOPHEN 325 MG PO TABS
650.0000 mg | ORAL_TABLET | Freq: Once | ORAL | Status: AC
  Administered 2017-09-25: 650 mg via ORAL
  Filled 2017-09-25: qty 2

## 2017-09-25 MED ORDER — SODIUM CHLORIDE 0.9 % IV BOLUS
1000.0000 mL | Freq: Once | INTRAVENOUS | Status: AC
  Administered 2017-09-25: 1000 mL via INTRAVENOUS

## 2017-09-25 MED ORDER — IOPAMIDOL (ISOVUE-300) INJECTION 61%
INTRAVENOUS | Status: AC
  Filled 2017-09-25: qty 100

## 2017-09-25 MED ORDER — PROMETHAZINE HCL 25 MG/ML IJ SOLN
25.0000 mg | Freq: Once | INTRAMUSCULAR | Status: AC
  Administered 2017-09-25: 25 mg via INTRAVENOUS
  Filled 2017-09-25: qty 1

## 2017-09-25 MED ORDER — IOPAMIDOL (ISOVUE-300) INJECTION 61%
100.0000 mL | Freq: Once | INTRAVENOUS | Status: AC | PRN
  Administered 2017-09-25: 100 mL via INTRAVENOUS

## 2017-09-25 NOTE — ED Provider Notes (Signed)
  Physical Exam  BP 130/78   Pulse 96   Temp 98 F (36.7 C) (Oral)   Resp 19   SpO2 98%   Physical Exam  Constitutional: He appears well-developed and well-nourished. No distress.  HENT:  Head: Normocephalic and atraumatic.  Eyes: Conjunctivae and EOM are normal. No scleral icterus.  Neck: Normal range of motion.  Pulmonary/Chest: Effort normal. No respiratory distress.  Neurological: He is alert.  Skin: No rash noted. He is not diaphoretic.  Psychiatric: He has a normal mood and affect.  Nursing note and vitals reviewed.   ED Course/Procedures   Clinical Course as of Sep 26 1919  Thu Sep 25, 2017  1210 Informed that patient was refusing additional lab draws.  RN reports concern for hemolysis based of difficult blood draw.    [EH]  1401 Re-check of potassium, is slightly higher.  Will continue rehydration, re-check.   Potassium(!): 5.7 [EH]    Clinical Course User Index [EH] Cristina GongHammond, Elizabeth W, PA-C    Procedures  MDM  Care handed off from previous provider, E.Hammond, PA-C.  Please see their note for further detail.  Briefly, patient presents for evaluation of abdominal pain, nausea, vomiting and diarrhea for the past 12 hours.  Reports generalized abdominal pain.  Also reports dark bowel movements.  He refused a rectal exam and Hemoccult testing.  Lab shows normal hemoglobin and hematocrit.  CMP shows elevation in potassium to 5.4.  Repeat obtained was 5.7.  Kidney function within normal limits.  CT of the abdomen and pelvis shows findings consistent with enteritis.  Plan is to give 3 L of IV fluids and recheck potassium.  On recheck, patient potassium level is normal at 3.5.  Unsure if this is related to a lab error.  Will advise him to follow-up with the wellness center for further evaluation and to take antiemetics as needed.  Patient tolerating p.o. intake without difficulty prior to discharge.  Advised to return to ED for any severe worsening symptoms.  Portions of  this note were generated with Scientist, clinical (histocompatibility and immunogenetics)Dragon dictation software. Dictation errors may occur despite best attempts at proofreading.    Dietrich PatesKhatri, Jammy Plotkin, PA-C 09/25/17 1946    Jacalyn LefevreHaviland, Julie, MD 09/25/17 2003

## 2017-09-25 NOTE — ED Notes (Signed)
Bed: WA25 Expected date:  Expected time:  Means of arrival:  Comments: EMS/n/v/d 

## 2017-09-25 NOTE — Discharge Instructions (Signed)
Your potassium level was normal when it was rechecked.

## 2017-09-25 NOTE — ED Notes (Signed)
Patient transported to CT 

## 2017-09-25 NOTE — ED Triage Notes (Signed)
Patient here from motel with complaints of abdominal pain, nausea, vomiting and blood in stools that started at 4am. CBG 114.

## 2017-09-25 NOTE — ED Provider Notes (Signed)
[facility] Provider Note   CSN: [id number] Arrival date & time: 09/25/17  4098     History   Chief Complaint Chief Complaint  Patient presents with  . Nausea  . Emesis  . Abdominal Pain  . Blood In Stools    HPI Fred Lara is a 42 y.o. male who presents today for evaluation of abdominal pain, nausea, vomiting, and diarrhea.  He reports that this started around 4 AM when he woke up.  He reports that his abdomen hurts generally all over.  He denies any chest pain or shortness of breath.  He is unsure if he has had any recent sick contacts.  He denies any recent trauma, no new rashes.  Reports that his back is sore also.  Denies any neck pain or headaches.  He reports dark tarry sticky BM.   HPI  Past Medical History:  Diagnosis Date  . Hypertension     There are no active problems to display for this patient.   History reviewed. No pertinent surgical history.      Home Medications    Prior to Admission medications   Not on File    Family History Family History  Problem Relation Age of Onset  . Cancer Mother     Social History Social History   Tobacco Use  . Smoking status: Current Every Day Smoker    Packs/day: 1.50    Years: 20.00    Pack years: 30.00    Types: Cigarettes  . Smokeless tobacco: Never Used  Substance Use Topics  . Alcohol use: No  . Drug use: No     Allergies   Patient has no known allergies.   Review of Systems Review of Systems  Constitutional: Negative for chills and fever.  Respiratory: Negative for chest tightness and wheezing.   Cardiovascular: Negative for chest pain.  Gastrointestinal: Positive for abdominal pain, diarrhea, nausea and vomiting.  Neurological: Negative for weakness, light-headedness and numbness.  All other systems reviewed and are negative.    Physical Exam Updated Vital Signs BP 130/78   Pulse 96   Temp 98 F (36.7 C) (Oral)   Resp 19   SpO2 98%    Physical Exam  Constitutional: He appears well-developed and well-nourished.  Non-toxic appearance. No distress.  HENT:  Head: Normocephalic and atraumatic.  Eyes: Conjunctivae are normal.  Neck: Neck supple.  Cardiovascular: Normal rate and regular rhythm.  No murmur heard. Pulmonary/Chest: Effort normal and breath sounds normal. No respiratory distress.  Abdominal: Soft. Normal appearance and bowel sounds are normal. There is generalized tenderness. There is no rigidity, no rebound and no guarding.  Actively vomiting during initial exam.   Genitourinary:  Genitourinary Comments: Patient refused GU exam, heme occult  Musculoskeletal: He exhibits no edema.  Neurological: He is alert.  Skin: Skin is warm and dry.  Psychiatric: He has a normal mood and affect.  Nursing note and vitals reviewed.    ED Treatments / Results  Labs (all labs ordered are listed, but only abnormal results are displayed) Labs Reviewed  COMPREHENSIVE METABOLIC PANEL - Abnormal; Notable for the following components:      Result Value   Potassium 5.4 (*)    Glucose, Bld 105 (*)    Calcium 8.7 (*)    Total Bilirubin 1.6 (*)    All other components within normal limits  CBC WITH DIFFERENTIAL/PLATELET - Abnormal; Notable for the following components:   Neutro Abs 8.0 (*)  Lymphs Abs 0.2 (*)    All other components within normal limits  BASIC METABOLIC PANEL - Abnormal; Notable for the following components:   Potassium 5.7 (*)    CO2 21 (*)    Calcium 7.3 (*)    All other components within normal limits  LIPASE, BLOOD  CK  BASIC METABOLIC PANEL  SAMPLE TO BLOOD BANK    EKG None  Radiology Ct Abdomen Pelvis W Contrast  Result Date: 09/25/2017 CLINICAL DATA:  42 year old male with acute abdominal pain, nausea and vomiting today. EXAM: CT ABDOMEN AND PELVIS WITH CONTRAST TECHNIQUE: Multidetector CT imaging of the abdomen and pelvis was performed using the standard protocol following bolus  administration of intravenous contrast. CONTRAST:  100mL ISOVUE-300 IOPAMIDOL (ISOVUE-300) INJECTION 61% COMPARISON:  None. FINDINGS: Lower chest: No acute abnormality. Hepatobiliary: The liver and gallbladder are unremarkable. No biliary dilatation. Pancreas: Unremarkable Spleen: Unremarkable Adrenals/Urinary Tract: The kidneys, adrenal glands and bladder are unremarkable. Stomach/Bowel: Apparent mild wall thickening of proximal and mid small bowel loops may represent an enteritis. There is no evidence of bowel obstruction, inflammation, or pneumoperitoneum. Vascular/Lymphatic: No significant vascular findings are present. No enlarged abdominal or pelvic lymph nodes. Reproductive: Prostate is unremarkable. Other: No ascites, abscess or abdominal wall hernia. Musculoskeletal: No acute or significant osseous findings. IMPRESSION: 1. Apparent mild wall thickening of proximal and mid small bowel loops-question enteritis. No evidence of bowel obstruction or inflammatory changes. 2. No other acute or significant abnormalities. Electronically Signed   By: Harmon PierJeffrey  Hu M.D.   On: 09/25/2017 13:39    Procedures Procedures (including critical care time)  Medications Ordered in ED Medications  iopamidol (ISOVUE-300) 61 % injection (has no administration in time range)  sodium chloride 0.9 % bolus 1,000 mL (1,000 mLs Intravenous New Bag/Given 09/25/17 1643)  sodium chloride 0.9 % bolus 1,000 mL (0 mLs Intravenous Stopped 09/25/17 1227)  promethazine (PHENERGAN) injection 25 mg (25 mg Intravenous Given 09/25/17 1115)  sodium chloride 0.9 % bolus 1,000 mL (0 mLs Intravenous Stopped 09/25/17 1643)  iopamidol (ISOVUE-300) 61 % injection 100 mL (100 mLs Intravenous Contrast Given 09/25/17 1314)     Initial Impression / Assessment and Plan / ED Course  I have reviewed the triage vital signs and the nursing notes.  Pertinent labs & imaging results that were available during my care of the patient were reviewed by me and  considered in my medical decision making (see chart for details).  Clinical Course as of Sep 25 1645  Thu Sep 25, 2017  1210 Informed that patient was refusing additional lab draws.  RN reports concern for hemolysis based of difficult blood draw.    [EH]  1401 Re-check of potassium, is slightly higher.  Will continue rehydration, re-check.   Potassium(!): 5.7 [EH]    Clinical Course User Index [EH] Cristina GongHammond, Shenita Trego W, PA-C   Patient presents today for evaluation of generalized abdominal pain, nausea, vomiting, and diarrhea.  He also reported dark tarry sticky bowel movements.  He refused rectal exam and Hemoccult testing.  Labs were obtained and reviewed, hemoglobin 15.8.  CMP with elevated total bilirubin of uncertain significance to 1.6.  Potassium also elevated to 5.4.  Based on abnormal lab, this was repeated with a potassium of 5.7.  Patient's kidney function is normal, creatinine around 1.  BUN is not elevated.  Will treat patient with additional fluids and then recheck.  [facility] shift change [facility] will follow pending studies, re-evaulate and determine disposition.  Final Clinical Impressions(s) / ED Diagnoses   Final diagnoses:  Nausea vomiting and diarrhea  Enteritis    ED Discharge Orders    None       Norman Clay 09/25/17 1652    Abelino Derrick, MD 09/26/17 2011
